# Patient Record
Sex: Female | Born: 1990 | Race: Black or African American | Hispanic: No | Smoking: Never smoker
Health system: Southern US, Community
[De-identification: ages and names within clinical notes are randomized; demographics above are authoritative.]

## PROBLEM LIST (undated history)

## (undated) ENCOUNTER — Inpatient Hospital Stay (HOSPITAL_COMMUNITY): Payer: Self-pay

## (undated) ENCOUNTER — Ambulatory Visit: Admission: EM | Payer: Medicaid Other | Source: Home / Self Care

## (undated) DIAGNOSIS — D649 Anemia, unspecified: Secondary | ICD-10-CM

## (undated) DIAGNOSIS — D696 Thrombocytopenia, unspecified: Secondary | ICD-10-CM

## (undated) DIAGNOSIS — Z789 Other specified health status: Secondary | ICD-10-CM

## (undated) DIAGNOSIS — J069 Acute upper respiratory infection, unspecified: Secondary | ICD-10-CM

## (undated) DIAGNOSIS — L309 Dermatitis, unspecified: Secondary | ICD-10-CM

## (undated) DIAGNOSIS — A749 Chlamydial infection, unspecified: Secondary | ICD-10-CM

## (undated) DIAGNOSIS — T783XXA Angioneurotic edema, initial encounter: Secondary | ICD-10-CM

## (undated) DIAGNOSIS — B9689 Other specified bacterial agents as the cause of diseases classified elsewhere: Secondary | ICD-10-CM

## (undated) DIAGNOSIS — A549 Gonococcal infection, unspecified: Secondary | ICD-10-CM

## (undated) DIAGNOSIS — N760 Acute vaginitis: Secondary | ICD-10-CM

## (undated) HISTORY — PX: NO PAST SURGERIES: SHX2092

## (undated) HISTORY — DX: Angioneurotic edema, initial encounter: T78.3XXA

## (undated) HISTORY — DX: Dermatitis, unspecified: L30.9

---
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
## 2013-09-23 ENCOUNTER — Encounter (HOSPITAL_COMMUNITY): Payer: Self-pay | Admitting: Advanced Practice Midwife

## 2013-09-23 ENCOUNTER — Inpatient Hospital Stay (HOSPITAL_COMMUNITY)
Admission: AD | Admit: 2013-09-23 | Discharge: 2013-09-23 | Disposition: A | Discharge status: Home / Self Care | Payer: Managed Care, Other (non HMO) | Source: Ambulatory Visit | Attending: Obstetrics & Gynecology | Admitting: Obstetrics & Gynecology

## 2013-09-23 ENCOUNTER — Inpatient Hospital Stay (HOSPITAL_COMMUNITY): Payer: Managed Care, Other (non HMO)

## 2013-09-23 DIAGNOSIS — B9689 Other specified bacterial agents as the cause of diseases classified elsewhere: Secondary | ICD-10-CM | POA: Diagnosis not present

## 2013-09-23 DIAGNOSIS — R1032 Left lower quadrant pain: Secondary | ICD-10-CM | POA: Diagnosis present

## 2013-09-23 DIAGNOSIS — O200 Threatened abortion: Secondary | ICD-10-CM | POA: Diagnosis not present

## 2013-09-23 DIAGNOSIS — A499 Bacterial infection, unspecified: Secondary | ICD-10-CM | POA: Diagnosis not present

## 2013-09-23 DIAGNOSIS — O2390 Unspecified genitourinary tract infection in pregnancy, unspecified trimester: Secondary | ICD-10-CM | POA: Insufficient documentation

## 2013-09-23 DIAGNOSIS — N760 Acute vaginitis: Secondary | ICD-10-CM | POA: Diagnosis not present

## 2013-09-23 HISTORY — DX: Thrombocytopenia, unspecified: D69.6

## 2013-09-23 LAB — WET PREP, GENITAL
Trich, Wet Prep: NONE SEEN
Yeast Wet Prep HPF POC: NONE SEEN

## 2013-09-23 LAB — CBC
HCT: 34.4 % — ABNORMAL LOW (ref 36.0–46.0)
Hemoglobin: 11.9 g/dL — ABNORMAL LOW (ref 12.0–15.0)
MCH: 28.5 pg (ref 26.0–34.0)
MCHC: 34.6 g/dL (ref 30.0–36.0)
MCV: 82.5 fL (ref 78.0–100.0)
PLATELETS: 208 10*3/uL (ref 150–400)
RBC: 4.17 MIL/uL (ref 3.87–5.11)
RDW: 13.8 % (ref 11.5–15.5)
WBC: 8.3 10*3/uL (ref 4.0–10.5)

## 2013-09-23 LAB — URINALYSIS, ROUTINE W REFLEX MICROSCOPIC
Bilirubin Urine: NEGATIVE
Glucose, UA: NEGATIVE mg/dL
Ketones, ur: NEGATIVE mg/dL
NITRITE: NEGATIVE
PH: 6 (ref 5.0–8.0)
Protein, ur: NEGATIVE mg/dL
Specific Gravity, Urine: 1.025 (ref 1.005–1.030)
Urobilinogen, UA: 0.2 mg/dL (ref 0.0–1.0)

## 2013-09-23 LAB — COMPREHENSIVE METABOLIC PANEL
ALT: 19 U/L (ref 0–35)
ANION GAP: 11 (ref 5–15)
AST: 18 U/L (ref 0–37)
Albumin: 3.9 g/dL (ref 3.5–5.2)
Alkaline Phosphatase: 52 U/L (ref 39–117)
BUN: 10 mg/dL (ref 6–23)
CALCIUM: 9.3 mg/dL (ref 8.4–10.5)
CO2: 25 mEq/L (ref 19–32)
Chloride: 103 mEq/L (ref 96–112)
Creatinine, Ser: 0.77 mg/dL (ref 0.50–1.10)
GFR calc non Af Amer: >90 mL/min (ref >90)
Glucose, Bld: 81 mg/dL (ref 70–99)
Potassium: 4.4 mEq/L (ref 3.7–5.3)
Sodium: 139 mEq/L (ref 137–147)
Total Bilirubin: <0.2 mg/dL — ABNORMAL LOW (ref 0.3–1.2)
Total Protein: 7.2 g/dL (ref 6.0–8.3)

## 2013-09-23 LAB — URINE MICROSCOPIC-ADD ON

## 2013-09-23 LAB — HCG, QUANTITATIVE, PREGNANCY: hCG, Beta Chain, Quant, S: 13563 m[IU]/mL — ABNORMAL HIGH (ref <5)

## 2013-09-23 LAB — POCT PREGNANCY, URINE: Preg Test, Ur: POSITIVE — AB

## 2013-09-23 LAB — ABO/RH: ABO/RH(D): B POS

## 2013-09-23 MED ORDER — METRONIDAZOLE 500 MG PO TABS: 500.0000 mg | ORAL_TABLET | Freq: Two times a day (BID) | ORAL | Status: DC

## 2013-09-23 NOTE — Discharge Instructions (Signed)
Bacterial Vaginosis Bacterial vaginosis is a vaginal infection that occurs when the normal balance of bacteria in the vagina is disrupted. It results from an overgrowth of certain bacteria. This is the most common vaginal infection in women of childbearing age. Treatment is important to prevent complications, especially in pregnant women, as it can cause a premature delivery. CAUSES  Bacterial vaginosis is caused by an increase in harmful bacteria that are normally present in smaller amounts in the vagina. Several different kinds of bacteria can cause bacterial vaginosis. However, the reason that the condition develops is not fully understood. RISK FACTORS Certain activities or behaviors can put you at an increased risk of developing bacterial vaginosis, including:  Having a new sex partner or multiple sex partners.  Douching.  Using an intrauterine device (IUD) for contraception. Women do not get bacterial vaginosis from toilet seats, bedding, swimming pools, or contact with objects around them. SIGNS AND SYMPTOMS  Some women with bacterial vaginosis have no signs or symptoms. Common symptoms include:  Grey vaginal discharge.  A fishlike odor with discharge, especially after sexual intercourse.  Itching or burning of the vagina and vulva.  Burning or pain with urination. DIAGNOSIS  Your health care provider will take a medical history and examine the vagina for signs of bacterial vaginosis. A sample of vaginal fluid may be taken. Your health care provider will look at this sample under a microscope to check for bacteria and abnormal cells. A vaginal pH test may also be done.  TREATMENT  Bacterial vaginosis may be treated with antibiotic medicines. These may be given in the form of a pill or a vaginal cream. A second round of antibiotics may be prescribed if the condition comes back after treatment.  HOME CARE INSTRUCTIONS   Only take over-the-counter or prescription medicines as  directed by your health care provider.  If antibiotic medicine was prescribed, take it as directed. Make sure you finish it even if you start to feel better.  Do not have sex until treatment is completed.  Tell all sexual partners that you have a vaginal infection. They should see their health care provider and be treated if they have problems, such as a mild rash or itching.  Practice safe sex by using condoms and only having one sex partner. SEEK MEDICAL CARE IF:   Your symptoms are not improving after 3 days of treatment.  You have increased discharge or pain.  You have a fever. MAKE SURE YOU:   Understand these instructions.  Will watch your condition.  Will get help right away if you are not doing well or get worse. FOR MORE INFORMATION  Centers for Disease Control and Prevention, Division of STD Prevention: AppraiserFraud.fi American Sexual Health Association (ASHA): www.ashastd.org  Document Released: 01/11/2005 Document Revised: 11/01/2012 Document Reviewed: 08/23/2012 [facility] Patient Information 2015 [place], [place]. This information is not intended to replace advice given to you by your health care provider. Make sure you discuss any questions you have with your health care provider.  Abdominal Pain During Pregnancy Abdominal pain is common in pregnancy. Most of the time, it does not cause harm. There are many causes of abdominal pain. Some causes are more serious than others. Some of the causes of abdominal pain in pregnancy are easily diagnosed. Occasionally, the diagnosis takes time to understand. Other times, the cause is not determined. Abdominal pain can be a sign that something is very wrong with the pregnancy, or the pain may have nothing to do with the pregnancy  at all. For this reason, always tell your health care provider if you have any abdominal discomfort. HOME CARE INSTRUCTIONS  Monitor your abdominal pain for any changes. The following actions may help to  alleviate any discomfort you are experiencing:  Do not have sexual intercourse or put anything in your vagina until your symptoms go away completely.  Get plenty of rest until your pain improves.  Drink clear fluids if you feel nauseous. Avoid solid food as long as you are uncomfortable or nauseous.  Only take over-the-counter or prescription medicine as directed by your health care provider.  Keep all follow-up appointments with your health care provider. SEEK IMMEDIATE MEDICAL CARE IF:  You are bleeding, leaking fluid, or passing tissue from the vagina.  You have increasing pain or cramping.  You have persistent vomiting.  You have painful or bloody urination.  You have a fever.  You notice a decrease in your baby's movements.  You have extreme weakness or feel faint.  You have shortness of breath, with or without abdominal pain.  You develop a severe headache with abdominal pain.  You have abnormal vaginal discharge with abdominal pain.  You have persistent diarrhea.  You have abdominal pain that continues even after rest, or gets worse. MAKE SURE YOU:   Understand these instructions.  Will watch your condition.  Will get help right away if you are not doing well or get worse. Document Released: 01/11/2005 Document Revised: 11/01/2012 Document Reviewed: 08/10/2012 [facility] Patient Information 2015 [place], [place]. This information is not intended to replace advice given to you by your health care provider. Make sure you discuss any questions you have with your health care provider.

## 2013-09-23 NOTE — MAU Provider Note (Signed)
Chief Complaint: Abdominal Cramping and Vaginal Bleeding   First Provider Initiated Contact with Patient 09/23/13 1359       SUBJECTIVE HPI: Sue Young is a 23 y.o. G1P0 at 5.5 weeks by LMP who presents with mild left lower quadrant cramping x3 days and spotting today. Positive home pregnancy test. No other testing this pregnancy.   Past Medical History  Diagnosis Date  . No pertinent past medical history   . Thrombocytopenia    OB History  Gravida Para Term Preterm AB SAB TAB Ectopic Multiple Living  2 1 1       1     # Outcome Date GA Lbr Len/2nd Weight Sex Delivery Anes PTL Lv  2 CUR           1 TRM              Past Surgical History  Procedure Laterality Date  . No past surgeries     History   Social History  . Marital Status: Married    Spouse Name: N/A    Number of Children: N/A  . Years of Education: N/A   Occupational History  . Not on file.   Social History Main Topics  . Smoking status: Never Smoker   . Smokeless tobacco: Never Used  . Alcohol Use: No  . Drug Use: No  . Sexual Activity: Yes    Birth Control/ Protection: None   Other Topics Concern  . Not on file   Social History Narrative  . No narrative on file   No current facility-administered medications on file prior to encounter.   Current Outpatient Prescriptions on File Prior to Encounter  Medication Sig Dispense Refill  . Prenatal Vit-Fe Fumarate-FA (PRENATAL MULTIVITAMIN) TABS Take 1 tablet by mouth at bedtime.       No Known Allergies  ROS: Pertinent positive items in HPI. Negative for fever, chills, nausea, vomiting, diarrhea, constipation, passage of tissue, vaginal discharge, urinary complaints or dyspareunia.  OBJECTIVE Blood pressure 139/77, pulse 82, temperature 98.4 F (36.9 C), resp. rate 18, height 5\' 6"  (1.676 m), weight 61.689 kg (136 lb), last menstrual period 08/14/2013. GENERAL: Well-developed, well-nourished female in no acute distress.  HEENT:  Normocephalic HEART: normal rate RESP: normal effort ABDOMEN: Soft, non-tender. No CVA tenderness. Positive bowel sounds x4. EXTREMITIES: Nontender, no edema NEURO: Alert and oriented SPECULUM EXAM: NEFG, moderate amount of thin, tan, malodorous discharge, no frank blood noted, cervix with 1.5 cm ectropion. Friable. No muco-purulent discharge. BIMANUAL: cervix closed; uterus slightly enlarged, no adnexal tenderness or masses. No cervical motion tenderness.  LAB RESULTS Results for orders placed during the hospital encounter of 09/23/13 (from the past 24 hour(s))  URINALYSIS, ROUTINE W REFLEX MICROSCOPIC     Status: Abnormal   Collection Time    09/23/13  1:35 PM      Result Value Ref Range   Color, Urine YELLOW  YELLOW   APPearance CLEAR  CLEAR   Specific Gravity, Urine 1.025  1.005 - 1.030   pH 6.0  5.0 - 8.0   Glucose, UA NEGATIVE  NEGATIVE mg/dL   Hgb urine dipstick LARGE (*) NEGATIVE   Bilirubin Urine NEGATIVE  NEGATIVE   Ketones, ur NEGATIVE  NEGATIVE mg/dL   Protein, ur NEGATIVE  NEGATIVE mg/dL   Urobilinogen, UA 0.2  0.0 - 1.0 mg/dL   Nitrite NEGATIVE  NEGATIVE   Leukocytes, UA TRACE (*) NEGATIVE  URINE MICROSCOPIC-ADD ON     Status: Abnormal   Collection Time  09/23/13  1:35 PM      Result Value Ref Range   Squamous Epithelial / LPF MANY (*) RARE   WBC, UA 7-10  <3 WBC/hpf   RBC / HPF 0-2  <3 RBC/hpf   Urine-Other MUCOUS PRESENT    POCT PREGNANCY, URINE     Status: Abnormal   Collection Time    09/23/13  1:44 PM      Result Value Ref Range   Preg Test, Ur POSITIVE (*) NEGATIVE  HCG, QUANTITATIVE, PREGNANCY     Status: Abnormal   Collection Time    09/23/13  1:55 PM      Result Value Ref Range   hCG, Beta Chain, Quant, S 13563 (*) <5 mIU/mL  ABO/RH     Status: None   Collection Time    09/23/13  1:55 PM      Result Value Ref Range   ABO/RH(D) B POS    CBC     Status: Abnormal   Collection Time    09/23/13  1:55 PM      Result Value Ref Range   WBC 8.3   4.0 - 10.5 K/uL   RBC 4.17  3.87 - 5.11 MIL/uL   Hemoglobin 11.9 (*) 12.0 - 15.0 g/dL   HCT 34.4 (*) 36.0 - 46.0 %   MCV 82.5  78.0 - 100.0 fL   MCH 28.5  26.0 - 34.0 pg   MCHC 34.6  30.0 - 36.0 g/dL   RDW 13.8  11.5 - 15.5 %   Platelets 208  150 - 400 K/uL  COMPREHENSIVE METABOLIC PANEL     Status: Abnormal   Collection Time    09/23/13  1:55 PM      Result Value Ref Range   Sodium 139  137 - 147 mEq/L   Potassium 4.4  3.7 - 5.3 mEq/L   Chloride 103  96 - 112 mEq/L   CO2 25  19 - 32 mEq/L   Glucose, Bld 81  70 - 99 mg/dL   BUN 10  6 - 23 mg/dL   Creatinine, Ser 0.77  0.50 - 1.10 mg/dL   Calcium 9.3  8.4 - 10.5 mg/dL   Total Protein 7.2  6.0 - 8.3 g/dL   Albumin 3.9  3.5 - 5.2 g/dL   AST 18  0 - 37 U/L   ALT 19  0 - 35 U/L   Alkaline Phosphatase 52  39 - 117 U/L   Total Bilirubin <0.2 (*) 0.3 - 1.2 mg/dL   GFR calc non Af Amer >90  >90 mL/min   GFR calc Af Amer >90  >90 mL/min   Anion gap 11  5 - 15  WET PREP, GENITAL     Status: Abnormal   Collection Time    09/23/13  2:52 PM      Result Value Ref Range   Yeast Wet Prep HPF POC NONE SEEN  NONE SEEN   Trich, Wet Prep NONE SEEN  NONE SEEN   Clue Cells Wet Prep HPF POC MANY (*) NONE SEEN   WBC, Wet Prep HPF POC MODERATE (*) NONE SEEN    IMAGING US Ob Comp Less 14 Wks  09/23/2013   CLINICAL DATA:  Vaginal bleeding  EXAM: OBSTETRIC <14 WK ULTRASOUND  TECHNIQUE: Transabdominal ultrasound was performed for evaluation of the gestation as well as the maternal uterus and adnexal regions.  COMPARISON:  None.  FINDINGS: Intrauterine gestational sac: Visualized/normal in shape.  Yolk sac:  Visualized  Embryo:  Visualized  Cardiac Activity: Visualized  Heart Rate: 97 bpm  MSD:   mm    w     d  CRL:   2.7  mm   5  w 6 d                  [place] EDC: 05/20/2014  Maternal uterus/adnexae: No subchorionic hemorrhage. 4.1 cm hemorrhagic cyst in the left ovary. Small amount of free fluid in the pelvis.  IMPRESSION: Five week 6 day intrauterine  pregnancy. Fetal heart rate 97 beats per min.  4.1 cm left ovarian hemorrhagic cyst   Electronically Signed   By: Rolm Baptise M.D.   On: 09/23/2013 15:48   US Ob Transvaginal  09/23/2013   CLINICAL DATA:  Vaginal bleeding  EXAM: OBSTETRIC <14 WK ULTRASOUND  TECHNIQUE: Transabdominal ultrasound was performed for evaluation of the gestation as well as the maternal uterus and adnexal regions.  COMPARISON:  None.  FINDINGS: Intrauterine gestational sac: Visualized/normal in shape.  Yolk sac:  Visualized  Embryo:  Visualized  Cardiac Activity: Visualized  Heart Rate: 97 bpm  MSD:   mm    w     d  CRL:   2.7  mm   5  w 6 d                  [place] EDC: 05/20/2014  Maternal uterus/adnexae: No subchorionic hemorrhage. 4.1 cm hemorrhagic cyst in the left ovary. Small amount of free fluid in the pelvis.  IMPRESSION: Five week 6 day intrauterine pregnancy. Fetal heart rate 97 beats per min.  4.1 cm left ovarian hemorrhagic cyst   Electronically Signed   By: Rolm Baptise M.D.   On: 09/23/2013 15:48   MAU COURSE  ASSESSMENT 1. Threatened miscarriage in early pregnancy   2. BV (bacterial vaginosis)    PLAN Discharge home in stable condition. Bleeding precautions. Pelvic rest x1 week.     Follow-up Information   Follow up with Catha Brow., MD. (Start prenatal care)    Specialty:  Obstetrics and Gynecology   Contact information:   [address] [place] [address] [phone]       Follow up with [facility]. (As needed in emergencies)    Contact information:   [address] [id number] [place] [address] [phone]       Medication List    STOP taking these medications       aspirin-acetaminophen-caffeine 250-250-65 MG per tablet  Commonly known as:  EXCEDRIN MIGRAINE      TAKE these medications       acetaminophen 325 MG tablet  Commonly known as:  TYLENOL  Take 650 mg by mouth every 4 (four) hours as needed  for mild pain or moderate pain.     metroNIDAZOLE 500 MG tablet  Commonly known as:  FLAGYL  Take 1 tablet (500 mg total) by mouth 2 (two) times daily.     prenatal multivitamin Tabs tablet  Take 1 tablet by mouth at bedtime.       [place], CNM 09/23/2013  4:00 PM

## 2013-09-23 NOTE — MAU Note (Signed)
Pt presents to MAU with complaints vaginal bleeding. Reports abdominal cramping for a couple of days. +HPT 2 weeks.

## 2013-09-25 LAB — GC/CHLAMYDIA PROBE AMP
CT Probe RNA: NEGATIVE
GC PROBE AMP APTIMA: NEGATIVE

## 2013-10-25 LAB — OB RESULTS CONSOLE ABO/RH: RH Type: POSITIVE

## 2013-10-25 LAB — OB RESULTS CONSOLE RUBELLA ANTIBODY, IGM: RUBELLA: IMMUNE

## 2013-10-25 LAB — OB RESULTS CONSOLE HIV ANTIBODY (ROUTINE TESTING): HIV: NONREACTIVE

## 2013-10-25 LAB — OB RESULTS CONSOLE GC/CHLAMYDIA
Chlamydia: NEGATIVE
Gonorrhea: NEGATIVE

## 2013-10-25 LAB — OB RESULTS CONSOLE ANTIBODY SCREEN: ANTIBODY SCREEN: NEGATIVE

## 2013-10-25 LAB — OB RESULTS CONSOLE RPR: RPR: NONREACTIVE

## 2013-11-26 ENCOUNTER — Encounter (HOSPITAL_COMMUNITY): Payer: Self-pay | Admitting: Advanced Practice Midwife

## 2014-01-02 ENCOUNTER — Inpatient Hospital Stay (HOSPITAL_COMMUNITY)
Admission: AD | Admit: 2014-01-02 | Discharge: 2014-01-02 | Disposition: A | Discharge status: Home / Self Care | Payer: Managed Care, Other (non HMO) | Source: Ambulatory Visit | Attending: Obstetrics | Admitting: Obstetrics

## 2014-01-02 ENCOUNTER — Encounter (HOSPITAL_COMMUNITY): Payer: Self-pay | Admitting: General Practice

## 2014-01-02 DIAGNOSIS — M549 Dorsalgia, unspecified: Secondary | ICD-10-CM | POA: Insufficient documentation

## 2014-01-02 DIAGNOSIS — J069 Acute upper respiratory infection, unspecified: Secondary | ICD-10-CM | POA: Diagnosis not present

## 2014-01-02 DIAGNOSIS — Z3A20 20 weeks gestation of pregnancy: Secondary | ICD-10-CM | POA: Insufficient documentation

## 2014-01-02 DIAGNOSIS — O99891 Other specified diseases and conditions complicating pregnancy: Secondary | ICD-10-CM

## 2014-01-02 DIAGNOSIS — R51 Headache: Secondary | ICD-10-CM | POA: Diagnosis present

## 2014-01-02 DIAGNOSIS — O9989 Other specified diseases and conditions complicating pregnancy, childbirth and the puerperium: Secondary | ICD-10-CM | POA: Insufficient documentation

## 2014-01-02 DIAGNOSIS — R6889 Other general symptoms and signs: Secondary | ICD-10-CM

## 2014-01-02 LAB — CBC WITH DIFFERENTIAL/PLATELET
BASOS ABS: 0 10*3/uL (ref 0.0–0.1)
BASOS PCT: 0 % (ref 0–1)
EOS ABS: 0.2 10*3/uL (ref 0.0–0.7)
Eosinophils Relative: 1 % (ref 0–5)
HCT: 32.2 % — ABNORMAL LOW (ref 36.0–46.0)
Hemoglobin: 11.1 g/dL — ABNORMAL LOW (ref 12.0–15.0)
Lymphocytes Relative: 8 % — ABNORMAL LOW (ref 12–46)
Lymphs Abs: 1.3 10*3/uL (ref 0.7–4.0)
MCH: 28.7 pg (ref 26.0–34.0)
MCHC: 34.5 g/dL (ref 30.0–36.0)
MCV: 83.2 fL (ref 78.0–100.0)
MONOS PCT: 7 % (ref 3–12)
Monocytes Absolute: 1.1 10*3/uL — ABNORMAL HIGH (ref 0.1–1.0)
NEUTROS ABS: 14.8 10*3/uL — AB (ref 1.7–7.7)
Neutrophils Relative %: 85 % — ABNORMAL HIGH (ref 43–77)
Platelets: 99 10*3/uL — ABNORMAL LOW (ref 150–400)
RBC: 3.87 MIL/uL (ref 3.87–5.11)
RDW: 13.6 % (ref 11.5–15.5)
WBC: 17.5 10*3/uL — ABNORMAL HIGH (ref 4.0–10.5)

## 2014-01-02 LAB — RAPID STREP SCREEN (MED CTR MEBANE ONLY): Streptococcus, Group A Screen (Direct): NEGATIVE

## 2014-01-02 LAB — URINALYSIS, ROUTINE W REFLEX MICROSCOPIC
Bilirubin Urine: NEGATIVE
Glucose, UA: NEGATIVE mg/dL
Ketones, ur: 15 mg/dL — AB
NITRITE: NEGATIVE
PROTEIN: NEGATIVE mg/dL
SPECIFIC GRAVITY, URINE: 1.015 (ref 1.005–1.030)
UROBILINOGEN UA: 0.2 mg/dL (ref 0.0–1.0)
pH: 7 (ref 5.0–8.0)

## 2014-01-02 LAB — URINE MICROSCOPIC-ADD ON

## 2014-01-02 LAB — INFLUENZA PANEL BY PCR (TYPE A & B)
H1N1FLUPCR: NOT DETECTED
INFLAPCR: NEGATIVE
Influenza B By PCR: NEGATIVE

## 2014-01-02 MED ORDER — METOCLOPRAMIDE HCL 5 MG/ML IJ SOLN
10.0000 mg | Freq: Once | INTRAMUSCULAR | Status: AC
  Administered 2014-01-02: 10 mg via INTRAVENOUS
  Filled 2014-01-02: qty 2

## 2014-01-02 MED ORDER — LACTATED RINGERS IV BOLUS (SEPSIS)
1000.0000 mL | Freq: Once | INTRAVENOUS | Status: AC
  Administered 2014-01-02: 1000 mL via INTRAVENOUS

## 2014-01-02 MED ORDER — DIPHENHYDRAMINE HCL 50 MG/ML IJ SOLN
25.0000 mg | Freq: Once | INTRAMUSCULAR | Status: AC
  Administered 2014-01-02: 25 mg via INTRAVENOUS
  Filled 2014-01-02: qty 1

## 2014-01-02 MED ORDER — DEXAMETHASONE SODIUM PHOSPHATE 10 MG/ML IJ SOLN
10.0000 mg | Freq: Once | INTRAMUSCULAR | Status: AC
  Administered 2014-01-02: 10 mg via INTRAVENOUS
  Filled 2014-01-02: qty 1

## 2014-01-02 NOTE — MAU Note (Signed)
R flank pain for the past 2 weeks - MD aware, HA & body aches started yesterday, body is tender to touch, slight cough & sore throat.  Denies fever.  Denies bleeding or LOF.

## 2014-01-02 NOTE — MAU Provider Note (Signed)
History     CSN: [id number]  Arrival date and time: 01/02/14 0845   None     Chief Complaint  Patient presents with  . Flank Pain  . Generalized Body Aches  . Headache   HPI   [name] is a 23 y.o. G2P1001 at [redacted]w[redacted]d female who presents with HA, back pain and body aches. She has had URI symptoms; the symptoms have gotten worse started yesterday. She has tried taking tylenol with minimal relief. The lower back pain has been going on for "weeks". She feels overall body pains- aches.  Temp at home was 99.0 Patient has not taken anything for the HA pain.   OB History    Gravida Para Term Preterm AB TAB SAB Ectopic Multiple Living   2 1 1       1       Past Medical History  Diagnosis Date  . No pertinent past medical history   . Thrombocytopenia     Past Surgical History  Procedure Laterality Date  . No past surgeries      Family History  Problem Relation Age of Onset  . Diabetes Mother     History  Substance Use Topics  . Smoking status: Never Smoker   . Smokeless tobacco: Never Used  . Alcohol Use: No    Allergies: No Known Allergies  Prescriptions prior to admission  Medication Sig Dispense Refill Last Dose  . acetaminophen (TYLENOL) 500 MG tablet Take 1,000 mg by mouth every 6 (six) hours as needed for mild pain or headache.   01/01/2014 at Unknown time  . Prenatal Vit-Fe Fumarate-FA (PRENATAL MULTIVITAMIN) TABS Take 1 tablet by mouth at bedtime.   01/01/2014 at Unknown time  . metroNIDAZOLE (FLAGYL) 500 MG tablet Take 1 tablet (500 mg total) by mouth 2 (two) times daily. (Patient not taking: Reported on 01/02/2014) 14 tablet 0    Results for orders placed or performed during the hospital encounter of 01/02/14 (from the past 48 hour(s))  Urinalysis, Routine w reflex microscopic     Status: Abnormal   Collection Time: 01/02/14  9:09 AM  Result Value Ref Range   Color, Urine YELLOW YELLOW   APPearance CLEAR CLEAR   Specific Gravity, Urine 1.015  1.005 - 1.030   pH 7.0 5.0 - 8.0   Glucose, UA NEGATIVE NEGATIVE mg/dL   Hgb urine dipstick TRACE (A) NEGATIVE   Bilirubin Urine NEGATIVE NEGATIVE   Ketones, ur 15 (A) NEGATIVE mg/dL   Protein, ur NEGATIVE NEGATIVE mg/dL   Urobilinogen, UA 0.2 0.0 - 1.0 mg/dL   Nitrite NEGATIVE NEGATIVE   Leukocytes, UA MODERATE (A) NEGATIVE  Urine microscopic-add on     Status: Abnormal   Collection Time: 01/02/14  9:09 AM  Result Value Ref Range   Squamous Epithelial / LPF RARE RARE   WBC, UA 7-10 <3 WBC/hpf   Bacteria, UA FEW (A) RARE  CBC with Differential     Status: Abnormal   Collection Time: 01/02/14 10:06 AM  Result Value Ref Range   WBC 17.5 (H) 4.0 - 10.5 K/uL   RBC 3.87 3.87 - 5.11 MIL/uL   Hemoglobin 11.1 (L) 12.0 - 15.0 g/dL   HCT 32.2 (L) 36.0 - 46.0 %   MCV 83.2 78.0 - 100.0 fL   MCH 28.7 26.0 - 34.0 pg   MCHC 34.5 30.0 - 36.0 g/dL   RDW 13.6 11.5 - 15.5 %   Platelets 99 (L) 150 - 400 K/uL    Comment:  REPEATED TO VERIFY SPECIMEN CHECKED FOR CLOTS PLATELET COUNT CONFIRMED BY SMEAR    Neutrophils Relative % 85 (H) 43 - 77 %   Neutro Abs 14.8 (H) 1.7 - 7.7 K/uL   Lymphocytes Relative 8 (L) 12 - 46 %   Lymphs Abs 1.3 0.7 - 4.0 K/uL   Monocytes Relative 7 3 - 12 %   Monocytes Absolute 1.1 (H) 0.1 - 1.0 K/uL   Eosinophils Relative 1 0 - 5 %   Eosinophils Absolute 0.2 0.0 - 0.7 K/uL   Basophils Relative 0 0 - 1 %   Basophils Absolute 0.0 0.0 - 0.1 K/uL  Influenza panel by PCR (type A & B, H1N1)     Status: None   Collection Time: 01/02/14 11:00 AM  Result Value Ref Range   Influenza A By PCR NEGATIVE NEGATIVE   Influenza B By PCR NEGATIVE NEGATIVE   H1N1 flu by pcr NOT DETECTED NOT DETECTED    Comment:        The Xpert Flu assay (FDA approved for nasal aspirates or washes and nasopharyngeal swab specimens), is intended as an aid in the diagnosis of influenza and should not be used as a sole basis for treatment. Performed at [facility]   Rapid strep screen      Status: None   Collection Time: 01/02/14 11:00 AM  Result Value Ref Range   Streptococcus, Group A Screen (Direct) NEGATIVE NEGATIVE    Comment: (NOTE) A Rapid Antigen test may result negative if the antigen level in the sample is below the detection level of this test. The FDA has not cleared this test as a stand-alone test therefore the rapid antigen negative result has reflexed to a Group A Strep culture. Performed at [facility]     Review of Systems  Constitutional: Positive for fever and chills.  HENT: Positive for congestion, ear pain and sore throat.   Respiratory: Positive for cough.   Neurological: Positive for headaches (Rates her pain 10/10; feels like a migraine. ).   Physical Exam   Blood pressure 127/75, pulse 127, temperature 99 F (37.2 C), temperature source Oral, resp. rate 18, last menstrual period 08/14/2013, SpO2 100 %.  Physical Exam  Constitutional: She is oriented to person, place, and time. She appears well-developed and well-nourished. She has a sickly appearance. She appears ill. No distress.  HENT:  Right Ear: Hearing and external ear normal. No drainage or tenderness. Tympanic membrane is erythematous. Tympanic membrane is not bulging.  Left Ear: Hearing and external ear normal. No drainage or tenderness. Tympanic membrane is erythematous. Tympanic membrane is not bulging.  Eyes: Pupils are equal, round, and reactive to light.  Cardiovascular: Normal rate and normal heart sounds.   Respiratory: Effort normal and breath sounds normal. No respiratory distress. She has no wheezes. She has no rales.  GI: There is no CVA tenderness.  Musculoskeletal: Normal range of motion.  Neurological: She is alert and oriented to person, place, and time.  Skin: Skin is warm. She is not diaphoretic.  Psychiatric: Her behavior is normal.    MAU Course  Procedures  None  MDM UA  Fetal heart tones by doppler+ CBC Influenza/strep swab collected LR bolus  with benadryl, reglan, decadron  Urine culture pending; will wait to treat until culture results; ROS negative  Discussed patient with Dr. Carlis Abbott @ 1030. Patient states that her HA has subsided   Assessment and Plan   A: 1. Upper respiratory virus   2.  Back pain in pregnancy   3. Flu-like symptoms    P: Discharge home in stable condition A list of safe medications given to patient for OTC cold medicine  Ok to take tylenol as needed, as directed on the bottle Increase water intake  I will call the patient with results of flu and strep Follow up with OB as scheduled or sooner if symptoms do not improve over the next few days.   Darrelyn Hillock Rasch, NP 01/02/2014 1:31 PM   1615: Patient notified of influenza and strep swab results.

## 2014-01-03 LAB — URINE CULTURE
Colony Count: 50000
Special Requests: NORMAL

## 2014-01-04 LAB — CULTURE, GROUP A STREP

## 2014-01-25 NOTE — L&D Delivery Note (Signed)
Delivery Note Patient c/o pressure and was noted to be 10/10/+3.  She pushed once, and at 12:11 PM a viable female was delivered via Vaginal, Spontaneous Delivery (Presentation: Left Occiput Anterior).  APGAR: 8, 9; weight  pending.   Placenta status: Intact, Spontaneous.  Cord: 3 vessels with the following complications: None.  Cord pH: n/a  Anesthesia: Local  Episiotomy: None Lacerations:  2nd degree perineal Suture Repair: 3.0 vicryl rapide Est. Blood Loss (mL): 575  Immediately after delivery of the placenta, brisk vaginal bleeding was noted.  A uterine sweep was performed and a small amount of clot removed from the LUS.  After the repair, again, brisk vaginal bleeding noted.  Fundus was firm, and fundal massage improved bleeding.  1000 mcg of rectal cytotec was placed apophylactically.    Mom to postpartum.  Baby to Couplet care / Skin to Skin.  [place] 05/24/2014, 1:24 PM

## 2014-03-18 ENCOUNTER — Telehealth: Payer: Self-pay | Admitting: Hematology

## 2014-03-18 NOTE — Telephone Encounter (Signed)
pt confirmed appt for 04/10/14 at 10:30 w/Feng Dx:  Thrombocytopenic Referral Dr. Rogue Bussing

## 2014-04-10 ENCOUNTER — Ambulatory Visit: Payer: Medicaid Other

## 2014-04-10 ENCOUNTER — Ambulatory Visit (HOSPITAL_BASED_OUTPATIENT_CLINIC_OR_DEPARTMENT_OTHER): Payer: Medicaid Other | Admitting: Hematology

## 2014-04-10 ENCOUNTER — Other Ambulatory Visit: Payer: Self-pay | Admitting: *Deleted

## 2014-04-10 ENCOUNTER — Encounter: Payer: Self-pay | Admitting: Hematology

## 2014-04-10 ENCOUNTER — Telehealth: Payer: Self-pay | Admitting: Hematology

## 2014-04-10 ENCOUNTER — Ambulatory Visit (HOSPITAL_BASED_OUTPATIENT_CLINIC_OR_DEPARTMENT_OTHER): Payer: Medicaid Other

## 2014-04-10 VITALS — BP 136/68 | HR 100 | Temp 98.4°F | Resp 18 | Ht 66.0 in | Wt 170.5 lb

## 2014-04-10 DIAGNOSIS — D696 Thrombocytopenia, unspecified: Secondary | ICD-10-CM

## 2014-04-10 DIAGNOSIS — D649 Anemia, unspecified: Secondary | ICD-10-CM

## 2014-04-10 DIAGNOSIS — D508 Other iron deficiency anemias: Secondary | ICD-10-CM

## 2014-04-10 DIAGNOSIS — O99013 Anemia complicating pregnancy, third trimester: Secondary | ICD-10-CM

## 2014-04-10 LAB — URINALYSIS, MICROSCOPIC - CHCC
Bilirubin (Urine): NEGATIVE
Blood: NEGATIVE
Glucose: NEGATIVE mg/dL
Ketones: NEGATIVE mg/dL
NITRITE: NEGATIVE
PROTEIN: NEGATIVE mg/dL
Specific Gravity, Urine: 1.015 (ref 1.003–1.035)
UROBILINOGEN UR: 0.2 mg/dL (ref 0.2–1)
pH: 6.5 (ref 4.6–8.0)

## 2014-04-10 LAB — COMPREHENSIVE METABOLIC PANEL (CC13)
ALBUMIN: 3 g/dL — AB (ref 3.5–5.0)
ALK PHOS: 100 U/L (ref 40–150)
ALT: 14 U/L (ref 0–55)
AST: 15 U/L (ref 5–34)
Anion Gap: 9 mEq/L (ref 3–11)
BUN: 5.5 mg/dL — ABNORMAL LOW (ref 7.0–26.0)
CALCIUM: 8.8 mg/dL (ref 8.4–10.4)
CHLORIDE: 107 meq/L (ref 98–109)
CO2: 22 mEq/L (ref 22–29)
Creatinine: 0.7 mg/dL (ref 0.6–1.1)
EGFR: >90 mL/min/{1.73_m2} (ref >90)
Glucose: 101 mg/dl (ref 70–140)
POTASSIUM: 3.6 meq/L (ref 3.5–5.1)
SODIUM: 138 meq/L (ref 136–145)
TOTAL PROTEIN: 7.2 g/dL (ref 6.4–8.3)
Total Bilirubin: 0.47 mg/dL (ref 0.20–1.20)

## 2014-04-10 LAB — CBC & DIFF AND RETIC
BASO%: 0.2 % (ref 0.0–2.0)
BASOS ABS: 0 10*3/uL (ref 0.0–0.1)
EOS%: 3.1 % (ref 0.0–7.0)
Eosinophils Absolute: 0.3 10*3/uL (ref 0.0–0.5)
HEMATOCRIT: 33.6 % — AB (ref 34.8–46.6)
HGB: 11.5 g/dL — ABNORMAL LOW (ref 11.6–15.9)
IMMATURE RETIC FRACT: 10.6 % — AB (ref 1.60–10.00)
LYMPH#: 1.9 10*3/uL (ref 0.9–3.3)
LYMPH%: 18.1 % (ref 14.0–49.7)
MCH: 28.1 pg (ref 25.1–34.0)
MCHC: 34.2 g/dL (ref 31.5–36.0)
MCV: 82.2 fL (ref 79.5–101.0)
MONO#: 0.4 10*3/uL (ref 0.1–0.9)
MONO%: 3.9 % (ref 0.0–14.0)
NEUT#: 7.8 10*3/uL — ABNORMAL HIGH (ref 1.5–6.5)
NEUT%: 74.7 % (ref 38.4–76.8)
Platelets: 90 10*3/uL — ABNORMAL LOW (ref 145–400)
RBC: 4.09 10*6/uL (ref 3.70–5.45)
RDW: 13.6 % (ref 11.2–14.5)
Retic %: 1.58 % (ref 0.70–2.10)
Retic Ct Abs: 64.62 10*3/uL (ref 33.70–90.70)
WBC: 10.5 10*3/uL — ABNORMAL HIGH (ref 3.9–10.3)

## 2014-04-10 LAB — LACTATE DEHYDROGENASE (CC13): LDH: 181 U/L (ref 125–245)

## 2014-04-10 LAB — IRON AND TIBC CHCC
%SAT: 15 % — AB (ref 21–57)
Iron: 83 ug/dL (ref 41–142)
TIBC: 546 ug/dL — ABNORMAL HIGH (ref 236–444)
UIBC: 463 ug/dL — AB (ref 120–384)

## 2014-04-10 LAB — FERRITIN CHCC: Ferritin: 15 ng/ml (ref 9–269)

## 2014-04-10 LAB — DRAW EXTRA CLOT TUBE

## 2014-04-10 NOTE — Progress Notes (Signed)
Checked in new pt with no financial concerns at this time.  Pt's insurance should pay at 100% so financial assistance may not be needed but she has Raquel's card for any billing questions or concerns.

## 2014-04-10 NOTE — Telephone Encounter (Signed)
gv and printed appt sched adn avs for pt for March adn April...sed added tx.

## 2014-04-10 NOTE — Progress Notes (Signed)
[facility]  Telephone:([phone]) [phone] Fax:([phone]) [place] consult Note   Patient Care Team: Sanjuana Kava, MD as PCP - General (Obstetrics and Gynecology) 04/10/2014  CHIEF COMPLAINTS/PURPOSE OF CONSULTATION:  Thrombocytopenia   Referring physician: Dr. Alwyn Pea  HISTORY OF PRESENTING ILLNESS:  Sue Young 24 y.o. female who is pregnant at [redacted] weeks, is here because of thrombocytopenia.  This is her second pregnancy. She delivered a healthy baby about 3 years ago. She said that she had a normal platelet count until when she was admitted for delivery and her platelet counts was found to be low. She was not able to have epidural because that. She did not have excessive bleeding after the vaginal delivery. The patient her plate count recovered after delivery.  She has been doing well with this pregnancy except one episodes of flu syndromes in Dec 2015 and she was treated in emergency room.   She did notice some skin bruising and mild come bleeding when she brushes her teeth in the past months.  She denied any bleeding episodes including hematochezia, melana, hemoptysis, hematuria or epitaxis. She was not told to have low platelet count until last months when her CBC showed platelet count 26K. she was referred for further evaluation.She was found to have low plt 26K,   MEDICAL HISTORY:  Past Medical History  Diagnosis Date  . No pertinent past medical history   . Thrombocytopenia     SURGICAL HISTORY: Past Surgical History  Procedure Laterality Date  . No past surgeries      SOCIAL HISTORY: History   Social History  . Marital Status: Married    Spouse Name: N/A  . Number of Children: N/A  . Years of Education: N/A   Occupational History  . Not on file.   Social History Main Topics  . Smoking status: Never Smoker   . Smokeless tobacco: Never Used  . Alcohol Use: No  . Drug Use: No  . Sexual Activity: Yes    Birth Control/ Protection: None    Other Topics Concern  . Not on file   Social History Narrative    FAMILY HISTORY: Family History  Problem Relation Age of Onset  . Diabetes Mother     ALLERGIES:  has No Known Allergies.  MEDICATIONS:  Current Outpatient Prescriptions  Medication Sig Dispense Refill  . acetaminophen (TYLENOL) 500 MG tablet Take 1,000 mg by mouth every 6 (six) hours as needed for mild pain or headache.    . Prenatal Vit-Fe Fumarate-FA (PRENATAL MULTIVITAMIN) TABS Take 1 tablet by mouth at bedtime.     No current facility-administered medications for this visit.    REVIEW OF SYSTEMS:   Constitutional: Denies fevers, chills or abnormal night sweats Eyes: Denies blurriness of vision, double vision or watery eyes Ears, nose, mouth, throat, and face: Denies mucositis or sore throat Respiratory: Denies cough, dyspnea or wheezes Cardiovascular: Denies palpitation, chest discomfort or lower extremity swelling Gastrointestinal:  Denies nausea, heartburn or change in bowel habits Skin: Denies abnormal skin rashes Lymphatics: Denies new lymphadenopathy or easy bruising Neurological:Denies numbness, tingling or new weaknesses Behavioral/Psych: Mood is stable, no new changes  All other systems were reviewed with the patient and are negative.  PHYSICAL EXAMINATION: ECOG PERFORMANCE STATUS: 0 - Asymptomatic  Filed Vitals:   04/10/14 1134  BP: 136/68  Pulse: 100  Temp: 98.4 F (36.9 C)  Resp: 18   Filed Weights   04/10/14 1134  Weight: 170 lb 8 oz (77.338 kg)  GENERAL:alert, no distress and comfortable SKIN: skin color, texture, turgor are normal, no rashes or significant lesions, no petechia or bruising EYES: normal, conjunctiva are pink and non-injected, sclera clear OROPHARYNX:no exudate, no erythema and lips, buccal mucosa, and tongue normal  NECK: supple, thyroid normal size, non-tender, without nodularity LYMPH:  no palpable lymphadenopathy in the cervical, axillary or  inguinal LUNGS: clear to auscultation and percussion with normal breathing effort HEART: regular rate & rhythm and no murmurs and no lower extremity edema ABDOMEN:abdomen soft, non-tender and normal bowel sounds Musculoskeletal:no cyanosis of digits and no clubbing  PSYCH: alert & oriented x 3 with fluent speech NEURO: no focal motor/sensory deficits  LABORATORY DATA:  I have reviewed the data as listed CBC Latest Ref Rng 04/10/2014 01/02/2014 09/23/2013  WBC 3.9 - 10.3 10e3/uL 10.5(H) 17.5(H) 8.3  Hemoglobin 11.6 - 15.9 g/dL 11.5(L) 11.1(L) 11.9(L)  Hematocrit 34.8 - 46.6 % 33.6(L) 32.2(L) 34.4(L)  Platelets 145 - 400 10e3/uL 90 Platelet count consistent in citrate(L) 99(L) 208    CMP Latest Ref Rng 09/23/2013 07/07/2010  Glucose 70 - 99 mg/dL 81 86  BUN 6 - 23 mg/dL 10 13  Creatinine 0.50 - 1.10 mg/dL 0.77 0.72  Sodium 137 - 147 mEq/L 139 135  Potassium 3.7 - 5.3 mEq/L 4.4 4.1  Chloride 96 - 112 mEq/L 103 104  CO2 19 - 32 mEq/L 25 22  Calcium 8.4 - 10.5 mg/dL 9.3 8.6  Total Protein 6.0 - 8.3 g/dL 7.2 -  Total Bilirubin 0.3 - 1.2 mg/dL <0.2(L) -  Alkaline Phos 39 - 117 U/L 52 -  AST 0 - 37 U/L 18 -  ALT 0 - 35 U/L 19 -     RADIOGRAPHIC STUDIES: I have personally reviewed the radiological images as listed and agreed with the findings in the report. No results found.  ASSESSMENT & PLAN:  24 year old African-American female, without significant past medical history, presented with thrombocytopenia during her pregnancy.  1. Thrombocytopenia, probable related to her pregnancy. -I'll try to get her lab results from her prior delivery in [place] and her current OB Dr. Alwyn Pea.  -Giving her asymptomatic, no medication, normal CMP, this is likely ITP. HELLP syndrome is unlikely -I'll also check her liver and spleen after her delivery. We'll rule out chronic hepatitis B and hepatitis C -Her platelet count today is 90, I recommend close monitoring. I will consider steroids if her platelet  count drops to below 50. -If her platelet count drops significantly before delivery, I would consider dexamethasone pulse or IVIG in the hospital.  2.  normocytic hypoproductive anemia  -Her ferritin level is 15 today, iron saturation 15%, TIBC elevated at 546. This is consistent with iron deficient anemia  -I recommend her to take iron pill 1-2 tablets daily.   Follow-up: I'll see her back in 4 weeks. CBC weekly.  All questions were answered. The patient knows to call the clinic with any problems, questions or concerns. I spent 30 minutes counseling the patient face to face. The total time spent in the appointment was 40 minutes and more than 50% was on counseling.     Truitt Merle, MD 04/10/2014 11:51 AM

## 2014-04-11 LAB — FOLATE RBC: RBC Folate: 807 ng/mL (ref >280)

## 2014-04-11 LAB — ANCA SCREEN W REFLEX TITER
Atypical p-ANCA Screen: NEGATIVE
c-ANCA Screen: NEGATIVE
p-ANCA Screen: NEGATIVE

## 2014-04-11 LAB — HEPATITIS B SURFACE ANTIGEN: HEP B S AG: NEGATIVE

## 2014-04-11 LAB — PROTHROMBIN TIME
INR: 1.04 (ref <1.50)
Prothrombin Time: 13.6 seconds (ref 11.6–15.2)

## 2014-04-11 LAB — HAPTOGLOBIN: HAPTOGLOBIN: 111 mg/dL (ref 43–212)

## 2014-04-11 LAB — APTT: APTT: 28 s (ref 24–37)

## 2014-04-11 LAB — FIBRINOGEN: Fibrinogen: 447 mg/dL (ref 204–475)

## 2014-04-11 LAB — RHEUMATOID FACTOR: Rhuematoid fact SerPl-aCnc: <10 IU/mL (ref <=14)

## 2014-04-11 LAB — HEPATITIS C ANTIBODY: HCV Ab: NEGATIVE

## 2014-04-11 LAB — VITAMIN B12: VITAMIN B 12: 462 pg/mL (ref 211–911)

## 2014-04-11 LAB — D-DIMER, QUANTITATIVE (NOT AT ARMC): D DIMER QUANT: 0.59 ug{FEU}/mL — AB (ref 0.00–0.48)

## 2014-04-11 LAB — ANA: Anti Nuclear Antibody(ANA): NEGATIVE

## 2014-04-15 ENCOUNTER — Telehealth: Payer: Self-pay | Admitting: Hematology

## 2014-04-15 NOTE — Telephone Encounter (Signed)
Left message to confirm appointment changed from 04/13 to 04/4. Informed patient she can pick calendar up at next appointment.

## 2014-04-15 NOTE — Telephone Encounter (Signed)
04/15/14 fax a request of records to [phone] [facility] [place], [place])

## 2014-04-16 ENCOUNTER — Other Ambulatory Visit: Payer: Self-pay | Admitting: *Deleted

## 2014-04-16 DIAGNOSIS — D696 Thrombocytopenia, unspecified: Secondary | ICD-10-CM

## 2014-04-17 ENCOUNTER — Other Ambulatory Visit (HOSPITAL_BASED_OUTPATIENT_CLINIC_OR_DEPARTMENT_OTHER): Payer: Medicaid Other

## 2014-04-17 DIAGNOSIS — D696 Thrombocytopenia, unspecified: Secondary | ICD-10-CM

## 2014-04-17 DIAGNOSIS — O99013 Anemia complicating pregnancy, third trimester: Secondary | ICD-10-CM

## 2014-04-17 LAB — CBC WITH DIFFERENTIAL/PLATELET
BASO%: 0.5 % (ref 0.0–2.0)
BASOS ABS: 0.1 10*3/uL (ref 0.0–0.1)
EOS%: 5.4 % (ref 0.0–7.0)
Eosinophils Absolute: 0.6 10*3/uL — ABNORMAL HIGH (ref 0.0–0.5)
HCT: 33.8 % — ABNORMAL LOW (ref 34.8–46.6)
HGB: 11.1 g/dL — ABNORMAL LOW (ref 11.6–15.9)
LYMPH#: 1.4 10*3/uL (ref 0.9–3.3)
LYMPH%: 14.1 % (ref 14.0–49.7)
MCH: 27.3 pg (ref 25.1–34.0)
MCHC: 32.9 g/dL (ref 31.5–36.0)
MCV: 83 fL (ref 79.5–101.0)
MONO#: 0.8 10*3/uL (ref 0.1–0.9)
MONO%: 7.6 % (ref 0.0–14.0)
NEUT#: 7.4 10*3/uL — ABNORMAL HIGH (ref 1.5–6.5)
NEUT%: 72.4 % (ref 38.4–76.8)
Platelets: 154 10*3/uL (ref 145–400)
RBC: 4.08 10*6/uL (ref 3.70–5.45)
RDW: 14 % (ref 11.2–14.5)
WBC: 10.3 10*3/uL (ref 3.9–10.3)

## 2014-04-24 ENCOUNTER — Other Ambulatory Visit (HOSPITAL_BASED_OUTPATIENT_CLINIC_OR_DEPARTMENT_OTHER): Payer: Medicaid Other

## 2014-04-24 DIAGNOSIS — O99013 Anemia complicating pregnancy, third trimester: Secondary | ICD-10-CM

## 2014-04-24 DIAGNOSIS — D696 Thrombocytopenia, unspecified: Secondary | ICD-10-CM

## 2014-04-24 LAB — CBC WITH DIFFERENTIAL/PLATELET
BASO%: 0.8 % (ref 0.0–2.0)
BASOS ABS: 0.1 10*3/uL (ref 0.0–0.1)
EOS ABS: 0.6 10*3/uL — AB (ref 0.0–0.5)
EOS%: 5.2 % (ref 0.0–7.0)
HCT: 33.7 % — ABNORMAL LOW (ref 34.8–46.6)
HEMOGLOBIN: 10.8 g/dL — AB (ref 11.6–15.9)
LYMPH%: 15.2 % (ref 14.0–49.7)
MCH: 26.5 pg (ref 25.1–34.0)
MCHC: 32.2 g/dL (ref 31.5–36.0)
MCV: 82.3 fL (ref 79.5–101.0)
MONO#: 0.8 10*3/uL (ref 0.1–0.9)
MONO%: 6.9 % (ref 0.0–14.0)
NEUT#: 8.3 10*3/uL — ABNORMAL HIGH (ref 1.5–6.5)
NEUT%: 71.9 % (ref 38.4–76.8)
Platelets: 102 10*3/uL — ABNORMAL LOW (ref 145–400)
RBC: 4.09 10*6/uL (ref 3.70–5.45)
RDW: 14.1 % (ref 11.2–14.5)
WBC: 11.5 10*3/uL — ABNORMAL HIGH (ref 3.9–10.3)
lymph#: 1.8 10*3/uL (ref 0.9–3.3)

## 2014-04-24 LAB — OB RESULTS CONSOLE GBS: GBS: NEGATIVE

## 2014-05-01 ENCOUNTER — Other Ambulatory Visit (HOSPITAL_BASED_OUTPATIENT_CLINIC_OR_DEPARTMENT_OTHER): Payer: Medicaid Other

## 2014-05-01 DIAGNOSIS — C187 Malignant neoplasm of sigmoid colon: Secondary | ICD-10-CM

## 2014-05-01 DIAGNOSIS — D649 Anemia, unspecified: Secondary | ICD-10-CM | POA: Diagnosis not present

## 2014-05-01 DIAGNOSIS — D696 Thrombocytopenia, unspecified: Secondary | ICD-10-CM

## 2014-05-01 LAB — CBC WITH DIFFERENTIAL/PLATELET
BASO%: 0.4 % (ref 0.0–2.0)
BASOS ABS: 0 10*3/uL (ref 0.0–0.1)
EOS%: 4.7 % (ref 0.0–7.0)
Eosinophils Absolute: 0.5 10*3/uL (ref 0.0–0.5)
HCT: 34.3 % — ABNORMAL LOW (ref 34.8–46.6)
HEMOGLOBIN: 11 g/dL — AB (ref 11.6–15.9)
LYMPH#: 1.7 10*3/uL (ref 0.9–3.3)
LYMPH%: 15.2 % (ref 14.0–49.7)
MCH: 26.2 pg (ref 25.1–34.0)
MCHC: 31.9 g/dL (ref 31.5–36.0)
MCV: 82.1 fL (ref 79.5–101.0)
MONO#: 1 10*3/uL — AB (ref 0.1–0.9)
MONO%: 8.5 % (ref 0.0–14.0)
NEUT#: 8.2 10*3/uL — ABNORMAL HIGH (ref 1.5–6.5)
NEUT%: 71.2 % (ref 38.4–76.8)
PLATELETS: 102 10*3/uL — AB (ref 145–400)
RBC: 4.18 10*6/uL (ref 3.70–5.45)
RDW: 13.8 % (ref 11.2–14.5)
WBC: 11.4 10*3/uL — AB (ref 3.9–10.3)

## 2014-05-08 ENCOUNTER — Ambulatory Visit: Payer: Medicaid Other | Admitting: Hematology

## 2014-05-08 ENCOUNTER — Other Ambulatory Visit: Payer: Medicaid Other

## 2014-05-09 ENCOUNTER — Ambulatory Visit: Payer: Medicaid Other | Admitting: Hematology

## 2014-05-09 ENCOUNTER — Other Ambulatory Visit: Payer: Medicaid Other

## 2014-05-09 ENCOUNTER — Encounter (HOSPITAL_COMMUNITY): Payer: Self-pay | Admitting: *Deleted

## 2014-05-09 ENCOUNTER — Inpatient Hospital Stay (HOSPITAL_COMMUNITY)
Admission: AD | Admit: 2014-05-09 | Discharge: 2014-05-09 | Disposition: A | Discharge status: Home / Self Care | Payer: Medicaid Other | Source: Ambulatory Visit | Attending: Obstetrics and Gynecology | Admitting: Obstetrics and Gynecology

## 2014-05-09 DIAGNOSIS — Z3A38 38 weeks gestation of pregnancy: Secondary | ICD-10-CM | POA: Diagnosis not present

## 2014-05-09 LAB — POCT FERN TEST: POCT Fern Test: POSITIVE

## 2014-05-09 NOTE — Discharge Instructions (Signed)
Braxton Hicks Contractions °Contractions of the uterus can occur throughout pregnancy. Contractions are not always a sign that you are in labor.  °WHAT ARE BRAXTON HICKS CONTRACTIONS?  °Contractions that occur before labor are called Braxton Hicks contractions, or false labor. Toward the end of pregnancy (32-34 weeks), these contractions can develop more often and may become more forceful. This is not true labor because these contractions do not result in opening (dilatation) and thinning of the cervix. They are sometimes difficult to tell apart from true labor because these contractions can be forceful and people have different pain tolerances. You should not feel embarrassed if you go to the hospital with false labor. Sometimes, the only way to tell if you are in true labor is for your health care provider to look for changes in the cervix. °If there are no prenatal problems or other health problems associated with the pregnancy, it is completely safe to be sent home with false labor and await the onset of true labor. °HOW CAN YOU TELL THE DIFFERENCE BETWEEN TRUE AND FALSE LABOR? °False Labor °· The contractions of false labor are usually shorter and not as hard as those of true labor.   °· The contractions are usually irregular.   °· The contractions are often felt in the front of the lower abdomen and in the groin.   °· The contractions may go away when you walk around or change positions while lying down.   °· The contractions get weaker and are shorter lasting as time goes on.   °· The contractions do not usually become progressively stronger, regular, and closer together as with true labor.   °True Labor °1. Contractions in true labor last 30-70 seconds, become very regular, usually become more intense, and increase in frequency.   °2. The contractions do not go away with walking.   °3. The discomfort is usually felt in the top of the uterus and spreads to the lower abdomen and low back.   °4. True labor can  be determined by your health care provider with an exam. This will show that the cervix is dilating and getting thinner.   °WHAT TO REMEMBER °· Keep up with your usual exercises and follow other instructions given by your health care provider.   °· Take medicines as directed by your health care provider.   °· Keep your regular prenatal appointments.   °· Eat and drink lightly if you think you are going into labor.   °· If Braxton Hicks contractions are making you uncomfortable:   °· Change your position from lying down or resting to walking, or from walking to resting.   °· Sit and rest in a tub of warm water.   °· Drink 2-3 glasses of water. Dehydration may cause these contractions.   °· Do slow and deep breathing several times an hour.   °WHEN SHOULD I SEEK IMMEDIATE MEDICAL CARE? °Seek immediate medical care if: °· Your contractions become stronger, more regular, and closer together.   °· You have fluid leaking or gushing from your vagina.   °· You have a fever.   °· You pass blood-tinged mucus.   °· You have vaginal bleeding.   °· You have continuous abdominal pain.   °· You have low back pain that you never had before.   °· You feel your baby's head pushing down and causing pelvic pressure.   °· Your baby is not moving as much as it used to.   °Document Released: 01/11/2005 Document Revised: 01/16/2013 Document Reviewed: 10/23/2012 °[employer]® Patient Information ©2015 [employer]. This information is not intended to replace advice given to you by your health care   provider. Make sure you discuss any questions you have with your health care provider. ° °Fetal Movement Counts °Patient Name: __________________________________________________ Patient Due Date: ____________________ °Performing a fetal movement count is highly recommended in high-risk pregnancies, but it is good for every pregnant woman to do. Your health care provider may ask you to start counting fetal movements at 28 weeks of the pregnancy. Fetal  movements often increase: °· After eating a full meal. °· After physical activity. °· After eating or drinking something sweet or cold. °· At rest. °Pay attention to when you feel the baby is most active. This will help you notice a pattern of your baby's sleep and wake cycles and what factors contribute to an increase in fetal movement. It is important to perform a fetal movement count at the same time each day when your baby is normally most active.  °HOW TO COUNT FETAL MOVEMENTS °5. Find a quiet and comfortable area to sit or lie down on your left side. Lying on your left side provides the best blood and oxygen circulation to your baby. °6. Write down the day and time on a sheet of paper or in a journal. °7. Start counting kicks, flutters, swishes, rolls, or jabs in a 2-hour period. You should feel at least 10 movements within 2 hours. °8. If you do not feel 10 movements in 2 hours, wait 2-3 hours and count again. Look for a change in the pattern or not enough counts in 2 hours. °SEEK MEDICAL CARE IF: °· You feel less than 10 counts in 2 hours, tried twice. °· There is no movement in over an hour. °· The pattern is changing or taking longer each day to reach 10 counts in 2 hours. °· You feel the baby is not moving as he or she usually does. °Date: ____________ Movements: ____________ Start time: ____________ Finish time: ____________  °Date: ____________ Movements: ____________ Start time: ____________ Finish time: ____________ °Date: ____________ Movements: ____________ Start time: ____________ Finish time: ____________ °Date: ____________ Movements: ____________ Start time: ____________ Finish time: ____________ °Date: ____________ Movements: ____________ Start time: ____________ Finish time: ____________ °Date: ____________ Movements: ____________ Start time: ____________ Finish time: ____________ °Date: ____________ Movements: ____________ Start time: ____________ Finish time: ____________ °Date: ____________  Movements: ____________ Start time: ____________ Finish time: ____________  °Date: ____________ Movements: ____________ Start time: ____________ Finish time: ____________ °Date: ____________ Movements: ____________ Start time: ____________ Finish time: ____________ °Date: ____________ Movements: ____________ Start time: ____________ Finish time: ____________ °Date: ____________ Movements: ____________ Start time: ____________ Finish time: ____________ °Date: ____________ Movements: ____________ Start time: ____________ Finish time: ____________ °Date: ____________ Movements: ____________ Start time: ____________ Finish time: ____________ °Date: ____________ Movements: ____________ Start time: ____________ Finish time: ____________  °Date: ____________ Movements: ____________ Start time: ____________ Finish time: ____________ °Date: ____________ Movements: ____________ Start time: ____________ Finish time: ____________ °Date: ____________ Movements: ____________ Start time: ____________ Finish time: ____________ °Date: ____________ Movements: ____________ Start time: ____________ Finish time: ____________ °Date: ____________ Movements: ____________ Start time: ____________ Finish time: ____________ °Date: ____________ Movements: ____________ Start time: ____________ Finish time: ____________ °Date: ____________ Movements: ____________ Start time: ____________ Finish time: ____________  °Date: ____________ Movements: ____________ Start time: ____________ Finish time: ____________ °Date: ____________ Movements: ____________ Start time: ____________ Finish time: ____________ °Date: ____________ Movements: ____________ Start time: ____________ Finish time: ____________ °Date: ____________ Movements: ____________ Start time: ____________ Finish time: ____________ °Date: ____________ Movements: ____________ Start time: ____________ Finish time: ____________ °Date: ____________ Movements: ____________ Start time:  ____________ Finish time: ____________ °Date: ____________ Movements:   ____________ Start time: ____________ Finish time: ____________  °Date: ____________ Movements: ____________ Start time: ____________ Finish time: ____________ °Date: ____________ Movements: ____________ Start time: ____________ Finish time: ____________ °Date: ____________ Movements: ____________ Start time: ____________ Finish time: ____________ °Date: ____________ Movements: ____________ Start time: ____________ Finish time: ____________ °Date: ____________ Movements: ____________ Start time: ____________ Finish time: ____________ °Date: ____________ Movements: ____________ Start time: ____________ Finish time: ____________ °Date: ____________ Movements: ____________ Start time: ____________ Finish time: ____________  °Date: ____________ Movements: ____________ Start time: ____________ Finish time: ____________ °Date: ____________ Movements: ____________ Start time: ____________ Finish time: ____________ °Date: ____________ Movements: ____________ Start time: ____________ Finish time: ____________ °Date: ____________ Movements: ____________ Start time: ____________ Finish time: ____________ °Date: ____________ Movements: ____________ Start time: ____________ Finish time: ____________ °Date: ____________ Movements: ____________ Start time: ____________ Finish time: ____________ °Date: ____________ Movements: ____________ Start time: ____________ Finish time: ____________  °Date: ____________ Movements: ____________ Start time: ____________ Finish time: ____________ °Date: ____________ Movements: ____________ Start time: ____________ Finish time: ____________ °Date: ____________ Movements: ____________ Start time: ____________ Finish time: ____________ °Date: ____________ Movements: ____________ Start time: ____________ Finish time: ____________ °Date: ____________ Movements: ____________ Start time: ____________ Finish time: ____________ °Date:  ____________ Movements: ____________ Start time: ____________ Finish time: ____________ °Date: ____________ Movements: ____________ Start time: ____________ Finish time: ____________  °Date: ____________ Movements: ____________ Start time: ____________ Finish time: ____________ °Date: ____________ Movements: ____________ Start time: ____________ Finish time: ____________ °Date: ____________ Movements: ____________ Start time: ____________ Finish time: ____________ °Date: ____________ Movements: ____________ Start time: ____________ Finish time: ____________ °Date: ____________ Movements: ____________ Start time: ____________ Finish time: ____________ °Date: ____________ Movements: ____________ Start time: ____________ Finish time: ____________ °Document Released: 02/10/2006 Document Revised: 05/28/2013 Document Reviewed: 11/08/2011 °[employer]® Patient Information ©2015 [employer]. This information is not intended to replace advice given to you by your health care provider. Make sure you discuss any questions you have with your health care provider. ° °

## 2014-05-09 NOTE — MAU Note (Signed)
Pt reports  she started having ctx last night. Now ctx q10-15 min. Increased pelvic pressure. Reports some fluid leaking no bleeding . Lost mucus plug 2 days ago. 1cm last week dialled.

## 2014-05-09 NOTE — MAU Note (Signed)
Pt started having contractions last night and they have continued to get worse into this morning.  questionsable ROM.  Denies VB.

## 2014-05-15 ENCOUNTER — Telehealth: Payer: Self-pay | Admitting: Hematology

## 2014-05-15 NOTE — Telephone Encounter (Signed)
gave and printed pt new sched

## 2014-05-16 ENCOUNTER — Other Ambulatory Visit (HOSPITAL_BASED_OUTPATIENT_CLINIC_OR_DEPARTMENT_OTHER): Payer: Medicaid Other

## 2014-05-16 ENCOUNTER — Encounter: Payer: Self-pay | Admitting: Hematology

## 2014-05-16 ENCOUNTER — Telehealth: Payer: Self-pay | Admitting: Hematology

## 2014-05-16 ENCOUNTER — Ambulatory Visit (HOSPITAL_BASED_OUTPATIENT_CLINIC_OR_DEPARTMENT_OTHER): Payer: Medicaid Other | Admitting: Hematology

## 2014-05-16 VITALS — BP 125/73 | HR 93 | Temp 97.8°F | Resp 18 | Ht 66.0 in | Wt 179.9 lb

## 2014-05-16 DIAGNOSIS — O99013 Anemia complicating pregnancy, third trimester: Secondary | ICD-10-CM

## 2014-05-16 DIAGNOSIS — D696 Thrombocytopenia, unspecified: Secondary | ICD-10-CM | POA: Insufficient documentation

## 2014-05-16 DIAGNOSIS — D508 Other iron deficiency anemias: Secondary | ICD-10-CM

## 2014-05-16 LAB — CBC WITH DIFFERENTIAL/PLATELET
BASO%: 0.5 % (ref 0.0–2.0)
Basophils Absolute: 0.1 10*3/uL (ref 0.0–0.1)
EOS ABS: 0.4 10*3/uL (ref 0.0–0.5)
EOS%: 4.1 % (ref 0.0–7.0)
HCT: 34.4 % — ABNORMAL LOW (ref 34.8–46.6)
HGB: 11.2 g/dL — ABNORMAL LOW (ref 11.6–15.9)
LYMPH#: 1.9 10*3/uL (ref 0.9–3.3)
LYMPH%: 18 % (ref 14.0–49.7)
MCH: 26.5 pg (ref 25.1–34.0)
MCHC: 32.5 g/dL (ref 31.5–36.0)
MCV: 81.5 fL (ref 79.5–101.0)
MONO#: 0.8 10*3/uL (ref 0.1–0.9)
MONO%: 7.2 % (ref 0.0–14.0)
NEUT#: 7.5 10*3/uL — ABNORMAL HIGH (ref 1.5–6.5)
NEUT%: 70.2 % (ref 38.4–76.8)
Platelets: 90 10*3/uL — ABNORMAL LOW (ref 145–400)
RBC: 4.23 10*6/uL (ref 3.70–5.45)
RDW: 14.9 % — AB (ref 11.2–14.5)
WBC: 10.6 10*3/uL — AB (ref 3.9–10.3)

## 2014-05-16 NOTE — Telephone Encounter (Signed)
gave and printed appt sched anda vs for pt for April and May °

## 2014-05-16 NOTE — Progress Notes (Signed)
[place]  [phone] Fax:([phone] [place] consult Note   Patient Care Team: Sanjuana Kava, MD as PCP - General (Obstetrics and Gynecology) 05/16/2014  CHIEF COMPLAINTS/PURPOSE OF CONSULTATION:  Thrombocytopenia   Referring physician: Dr. Alwyn Pea  HISTORY OF PRESENTING ILLNESS:  Sue Young 23 y.o. female who is pregnant at [redacted] weeks, is here because of thrombocytopenia.  This is her second pregnancy. She delivered a healthy baby about 3 years ago. She said that she had a normal platelet count until when she was admitted for delivery and her platelet counts was found to be low. She was not able to have epidural because that. She did not have excessive bleeding after the vaginal delivery. The patient her plate count recovered after delivery.  She has been doing well with this pregnancy except one episodes of flu syndromes in Dec 2015 and she was treated in emergency room.   She did notice some skin bruising and mild come bleeding when she brushes her teeth in the past months.  She denied any bleeding episodes including hematochezia, melana, hemoptysis, hematuria or epitaxis. She was not told to have low platelet count until last months when her CBC showed platelet count 26K. she was referred for further evaluation.  INTERIM HISTORY  Sue Young returns for follow-up. She is in week 17 of her pregnancy now. She has been getting weekly CBC checked since her last visit, which showed platelet count in the range of 90-154. She denies any new bruising, or bleeding. She feels well overall.  MEDICAL HISTORY:  Past Medical History  Diagnosis Date  . No pertinent past medical history   . Thrombocytopenia     SURGICAL HISTORY: Past Surgical History  Procedure Laterality Date  . No past surgeries      SOCIAL HISTORY: History   Social History  . Marital Status: Married    Spouse Name: N/A  . Number of Children: N/A  . Years of Education: N/A    Occupational History  . Not on file.   Social History Main Topics  . Smoking status: Never Smoker   . Smokeless tobacco: Never Used  . Alcohol Use: No  . Drug Use: No  . Sexual Activity: Yes    Birth Control/ Protection: None   Other Topics Concern  . Not on file   Social History Narrative    FAMILY HISTORY: Family History  Problem Relation Age of Onset  . Diabetes Mother     ALLERGIES:  has No Known Allergies.  MEDICATIONS:  Current Outpatient Prescriptions  Medication Sig Dispense Refill  . acetaminophen (TYLENOL) 500 MG tablet Take 1,000 mg by mouth every 6 (six) hours as needed for mild pain or headache.    . Prenatal Vit-Fe Fumarate-FA (PRENATAL MULTIVITAMIN) TABS Take 1 tablet by mouth at bedtime.     No current facility-administered medications for this visit.    REVIEW OF SYSTEMS:   Constitutional: Denies fevers, chills or abnormal night sweats Eyes: Denies blurriness of vision, double vision or watery eyes Ears, nose, mouth, throat, and face: Denies mucositis or sore throat Respiratory: Denies cough, dyspnea or wheezes Cardiovascular: Denies palpitation, chest discomfort or lower extremity swelling Gastrointestinal:  Denies nausea, heartburn or change in bowel habits Skin: Denies abnormal skin rashes Lymphatics: Denies new lymphadenopathy or easy bruising Neurological:Denies numbness, tingling or new weaknesses Behavioral/Psych: Mood is stable, no new changes  All other systems were reviewed with the patient and are negative.  PHYSICAL EXAMINATION: ECOG PERFORMANCE STATUS: 0 -  Asymptomatic  Filed Vitals:   05/16/14 1031  BP: 125/73  Pulse: 93  Temp: 97.8 F (36.6 C)  Resp: 18   Filed Weights   05/16/14 1031  Weight: 179 lb 14.4 oz (81.602 kg)    GENERAL:alert, no distress and comfortable SKIN: skin color, texture, turgor are normal, no rashes or significant lesions, no petechia or bruising EYES: normal, conjunctiva are pink and  non-injected, sclera clear OROPHARYNX:no exudate, no erythema and lips, buccal mucosa, and tongue normal  NECK: supple, thyroid normal size, non-tender, without nodularity LYMPH:  no palpable lymphadenopathy in the cervical, axillary or inguinal LUNGS: clear to auscultation and percussion with normal breathing effort HEART: regular rate & rhythm and no murmurs and no lower extremity edema ABDOMEN:abdomen soft, non-tender and normal bowel sounds Musculoskeletal:no cyanosis of digits and no clubbing  PSYCH: alert & oriented x 3 with fluent speech NEURO: no focal motor/sensory deficits  LABORATORY DATA:  I have reviewed the data as listed CBC Latest Ref Rng 05/16/2014 05/01/2014 04/24/2014  WBC 3.9 - 10.3 10e3/uL 10.6(H) 11.4(H) 11.5(H)  Hemoglobin 11.6 - 15.9 g/dL 11.2(L) 11.0(L) 10.8(L)  Hematocrit 34.8 - 46.6 % 34.4(L) 34.3(L) 33.7(L)  Platelets 145 - 400 10e3/uL 90(L) 102(L) 102(L)    CMP Latest Ref Rng 04/10/2014 09/23/2013 07/07/2010  Glucose 70 - 140 mg/dl 101 81 86  BUN 7.0 - 26.0 mg/dL 5.5(L) 10 13  Creatinine 0.6 - 1.1 mg/dL 0.7 0.77 0.72  Sodium 136 - 145 mEq/L 138 139 135  Potassium 3.5 - 5.1 mEq/L 3.6 4.4 4.1  Chloride 96 - 112 mEq/L - 103 104  CO2 22 - 29 mEq/L 22 25 22   Calcium 8.4 - 10.4 mg/dL 8.8 9.3 8.6  Total Protein 6.4 - 8.3 g/dL 7.2 7.2 -  Total Bilirubin 0.20 - 1.20 mg/dL 0.47 <0.2(L) -  Alkaline Phos 40 - 150 U/L 100 52 -  AST 5 - 34 U/L 15 18 -  ALT 0 - 55 U/L 14 19 -     RADIOGRAPHIC STUDIES: I have personally reviewed the radiological images as listed and agreed with the findings in the report. No results found.  ASSESSMENT & PLAN:  24 year old African-American female, without significant past medical history, presented with thrombocytopenia during her pregnancy.  1. Thrombocytopenia, probable related to her pregnancy. -Medical records were obtained from her prior delivery in [place] lab results were missing. -Giving her asymptomatic, no medication,  normal CMP, this is likely pregnancy related some cytopenia or ITP. HELLP syndrome is unlikely given the normal liver function and blood pressure. -I'll also check her liver and spleen after her delivery. hepatitis B and hepatitis C were negative. -Her platelet count today is 90, I recommend close monitoring. I will consider steroids if her platelet count drops to below 50. -If her platelet count drops significantly before delivery, I would consider dexamethasone pulse or IVIG in the hospital, and platelet transfusion.  2.  normocytic hypoproductive anemia  -Her ferritin level is 15 today, iron saturation 15%, TIBC elevated at 546. This is consistent with iron deficient anemia  -I recommend her to take iron pill 1-2 tablets daily.   Follow-up:  I'll see her back after delivery, 5 weeks from now.  CBC weekly for the next two weeks if not delivery   All questions were answered. The patient knows to call the clinic with any problems, questions or concerns. I spent 20 minutes counseling the patient face to face. The total time spent in the appointment was 25 minutes and more  than 50% was on counseling.     Truitt Merle, MD 05/16/2014 10:43 AM

## 2014-05-22 ENCOUNTER — Encounter (HOSPITAL_COMMUNITY): Payer: Self-pay | Admitting: *Deleted

## 2014-05-22 ENCOUNTER — Inpatient Hospital Stay (HOSPITAL_COMMUNITY)
Admission: AD | Admit: 2014-05-22 | Discharge: 2014-05-22 | Disposition: A | Discharge status: Home / Self Care | Payer: Medicaid Other | Source: Ambulatory Visit | Attending: Obstetrics and Gynecology | Admitting: Obstetrics and Gynecology

## 2014-05-22 DIAGNOSIS — O480 Post-term pregnancy: Principal | ICD-10-CM | POA: Diagnosis present

## 2014-05-22 DIAGNOSIS — Z3A40 40 weeks gestation of pregnancy: Secondary | ICD-10-CM

## 2014-05-22 DIAGNOSIS — D693 Immune thrombocytopenic purpura: Secondary | ICD-10-CM | POA: Diagnosis present

## 2014-05-22 DIAGNOSIS — O9912 Other diseases of the blood and blood-forming organs and certain disorders involving the immune mechanism complicating childbirth: Secondary | ICD-10-CM | POA: Diagnosis present

## 2014-05-22 NOTE — MAU Note (Signed)
Pt states she started having pain last night, contractions became more regular today. Pt states she feels like she is open

## 2014-05-22 NOTE — Progress Notes (Signed)
Pt states she has been"pooping all day"

## 2014-05-22 NOTE — MAU Note (Signed)
Pt reports contractions, denies bleeding or ROM.  

## 2014-05-22 NOTE — Discharge Instructions (Signed)
Third Trimester of Pregnancy The third trimester is from week 29 through week 42, months 7 through 9. The third trimester is a time when the fetus is growing rapidly. At the end of the ninth month, the fetus is about 20 inches in length and weighs 6-10 pounds.  BODY CHANGES Your body goes through many changes during pregnancy. The changes vary from woman to woman.   Your weight will continue to increase. You can expect to gain 25-35 pounds (11-16 kg) by the end of the pregnancy.  You may begin to get stretch marks on your hips, abdomen, and breasts.  You may urinate more often because the fetus is moving lower into your pelvis and pressing on your bladder.  You may develop or continue to have heartburn as a result of your pregnancy.  You may develop constipation because certain hormones are causing the muscles that push waste through your intestines to slow down.  You may develop hemorrhoids or swollen, bulging veins (varicose veins).  You may have pelvic pain because of the weight gain and pregnancy hormones relaxing your joints between the bones in your pelvis. Backaches may result from overexertion of the muscles supporting your posture.  You may have changes in your hair. These can include thickening of your hair, rapid growth, and changes in texture. Some women also have hair loss during or after pregnancy, or hair that feels dry or thin. Your hair will most likely return to normal after your baby is born.  Your breasts will continue to grow and be tender. A yellow discharge may leak from your breasts called colostrum.  Your belly button may stick out.  You may feel short of breath because of your expanding uterus.  You may notice the fetus "dropping," or moving lower in your abdomen.  You may have a bloody mucus discharge. This usually occurs a few days to a week before labor begins.  Your cervix becomes thin and soft (effaced) near your due date. WHAT TO EXPECT AT YOUR PRENATAL  EXAMS  You will have prenatal exams every 2 weeks until week 36. Then, you will have weekly prenatal exams. During a routine prenatal visit:  You will be weighed to make sure you and the fetus are growing normally.  Your blood pressure is taken.  Your abdomen will be measured to track your baby's growth.  The fetal heartbeat will be listened to.  Any test results from the previous visit will be discussed.  You may have a cervical check near your due date to see if you have effaced. At around 36 weeks, your caregiver will check your cervix. At the same time, your caregiver will also perform a test on the secretions of the vaginal tissue. This test is to determine if a type of bacteria, Group B streptococcus, is present. Your caregiver will explain this further. Your caregiver may ask you:  What your birth plan is.  How you are feeling.  If you are feeling the baby move.  If you have had any abnormal symptoms, such as leaking fluid, bleeding, severe headaches, or abdominal cramping.  If you have any questions. Other tests or screenings that may be performed during your third trimester include:  Blood tests that check for low iron levels (anemia).  Fetal testing to check the health, activity level, and growth of the fetus. Testing is done if you have certain medical conditions or if there are problems during the pregnancy. FALSE LABOR You may feel small, irregular contractions that  eventually go away. These are called Braxton Hicks contractions, or false labor. Contractions may last for hours, days, or even weeks before true labor sets in. If contractions come at regular intervals, intensify, or become painful, it is best to be seen by your caregiver.  SIGNS OF LABOR   Menstrual-like cramps.  Contractions that are 5 minutes apart or less.  Contractions that start on the top of the uterus and spread down to the lower abdomen and back.  A sense of increased pelvic pressure or back  pain.  A watery or bloody mucus discharge that comes from the vagina. If you have any of these signs before the 37th week of pregnancy, call your caregiver right away. You need to go to the hospital to get checked immediately. HOME CARE INSTRUCTIONS   Avoid all smoking, herbs, alcohol, and unprescribed drugs. These chemicals affect the formation and growth of the baby.  Follow your caregiver's instructions regarding medicine use. There are medicines that are either safe or unsafe to take during pregnancy.  Exercise only as directed by your caregiver. Experiencing uterine cramps is a good sign to stop exercising.  Continue to eat regular, healthy meals.  Wear a good support bra for breast tenderness.  Do not use hot tubs, steam rooms, or saunas.  Wear your seat belt at all times when driving.  Avoid raw meat, uncooked cheese, cat litter boxes, and soil used by cats. These carry germs that can cause birth defects in the baby.  Take your prenatal vitamins.  Try taking a stool softener (if your caregiver approves) if you develop constipation. Eat more high-fiber foods, such as fresh vegetables or fruit and whole grains. Drink plenty of fluids to keep your urine clear or pale yellow.  Take warm sitz baths to soothe any pain or discomfort caused by hemorrhoids. Use hemorrhoid cream if your caregiver approves.  If you develop varicose veins, wear support hose. Elevate your feet for 15 minutes, 3-4 times a day. Limit salt in your diet.  Avoid heavy lifting, wear low heal shoes, and practice good posture.  Rest a lot with your legs elevated if you have leg cramps or low back pain.  Visit your dentist if you have not gone during your pregnancy. Use a soft toothbrush to brush your teeth and be gentle when you floss.  A sexual relationship may be continued unless your caregiver directs you otherwise.  Do not travel far distances unless it is absolutely necessary and only with the approval  of your caregiver.  Take prenatal classes to understand, practice, and ask questions about the labor and delivery.  Make a trial run to the hospital.  Pack your hospital bag.  Prepare the baby's nursery.  Continue to go to all your prenatal visits as directed by your caregiver. SEEK MEDICAL CARE IF:  You are unsure if you are in labor or if your water has broken.  You have dizziness.  You have mild pelvic cramps, pelvic pressure, or nagging pain in your abdominal area.  You have persistent nausea, vomiting, or diarrhea.  You have a bad smelling vaginal discharge.  You have pain with urination. SEEK IMMEDIATE MEDICAL CARE IF:   You have a fever.  You are leaking fluid from your vagina.  You have spotting or bleeding from your vagina.  You have severe abdominal cramping or pain.  You have rapid weight loss or gain.  You have shortness of breath with chest pain.  You notice sudden or extreme swelling  of your face, hands, ankles, feet, or legs.  You have not felt your baby move in over an hour.  You have severe headaches that do not go away with medicine.  You have vision changes. Document Released: 01/05/2001 Document Revised: 01/16/2013 Document Reviewed: 03/14/2012 [facility] Patient Information 2015 [place], [place]. This information is not intended to replace advice given to you by your health care provider. Make sure you discuss any questions you have with your health care provider. Fetal Movement Counts Patient Name: __________________________________________________ Patient Due Date: ____________________ Performing a fetal movement count is highly recommended in high-risk pregnancies, but it is good for every pregnant woman to do. Your health care provider may ask you to start counting fetal movements at 28 weeks of the pregnancy. Fetal movements often increase:  After eating a full meal.  After physical activity.  After eating or drinking something sweet or  cold.  At rest. Pay attention to when you feel the baby is most active. This will help you notice a pattern of your baby's sleep and wake cycles and what factors contribute to an increase in fetal movement. It is important to perform a fetal movement count at the same time each day when your baby is normally most active.  HOW TO COUNT FETAL MOVEMENTS  Find a quiet and comfortable area to sit or lie down on your left side. Lying on your left side provides the best blood and oxygen circulation to your baby.  Write down the day and time on a sheet of paper or in a journal.  Start counting kicks, flutters, swishes, rolls, or jabs in a 2-hour period. You should feel at least 10 movements within 2 hours.  If you do not feel 10 movements in 2 hours, wait 2-3 hours and count again. Look for a change in the pattern or not enough counts in 2 hours. SEEK MEDICAL CARE IF:  You feel less than 10 counts in 2 hours, tried twice.  There is no movement in over an hour.  The pattern is changing or taking longer each day to reach 10 counts in 2 hours.  You feel the baby is not moving as he or she usually does. Date: ____________ Movements: ____________ Start time: ____________ Elizebeth Koller time: ____________  Date: ____________ Movements: ____________ Start time: ____________ Elizebeth Koller time: ____________ Date: ____________ Movements: ____________ Start time: ____________ Elizebeth Koller time: ____________ Date: ____________ Movements: ____________ Start time: ____________ Elizebeth Koller time: ____________ Date: ____________ Movements: ____________ Start time: ____________ Elizebeth Koller time: ____________ Date: ____________ Movements: ____________ Start time: ____________ Elizebeth Koller time: ____________ Date: ____________ Movements: ____________ Start time: ____________ Elizebeth Koller time: ____________ Date: ____________ Movements: ____________ Start time: ____________ Elizebeth Koller time: ____________  Date: ____________ Movements: ____________ Start time:  ____________ Elizebeth Koller time: ____________ Date: ____________ Movements: ____________ Start time: ____________ Elizebeth Koller time: ____________ Date: ____________ Movements: ____________ Start time: ____________ Elizebeth Koller time: ____________ Date: ____________ Movements: ____________ Start time: ____________ Elizebeth Koller time: ____________ Date: ____________ Movements: ____________ Start time: ____________ Elizebeth Koller time: ____________ Date: ____________ Movements: ____________ Start time: ____________ Elizebeth Koller time: ____________ Date: ____________ Movements: ____________ Start time: ____________ Elizebeth Koller time: ____________  Date: ____________ Movements: ____________ Start time: ____________ Elizebeth Koller time: ____________ Date: ____________ Movements: ____________ Start time: ____________ Elizebeth Koller time: ____________ Date: ____________ Movements: ____________ Start time: ____________ Elizebeth Koller time: ____________ Date: ____________ Movements: ____________ Start time: ____________ Elizebeth Koller time: ____________ Date: ____________ Movements: ____________ Start time: ____________ Elizebeth Koller time: ____________ Date: ____________ Movements: ____________ Start time: ____________ Elizebeth Koller time: ____________ Date: ____________ Movements: ____________ Start time: ____________ Elizebeth Koller time:  ____________  Date: ____________ Movements: ____________ Start time: ____________ Elizebeth Koller time: ____________ Date: ____________ Movements: ____________ Start time: ____________ Elizebeth Koller time: ____________ Date: ____________ Movements: ____________ Start time: ____________ Elizebeth Koller time: ____________ Date: ____________ Movements: ____________ Start time: ____________ Elizebeth Koller time: ____________ Date: ____________ Movements: ____________ Start time: ____________ Elizebeth Koller time: ____________ Date: ____________ Movements: ____________ Start time: ____________ Elizebeth Koller time: ____________ Date: ____________ Movements: ____________ Start time: ____________ Elizebeth Koller time: ____________  Date:  ____________ Movements: ____________ Start time: ____________ Elizebeth Koller time: ____________ Date: ____________ Movements: ____________ Start time: ____________ Elizebeth Koller time: ____________ Date: ____________ Movements: ____________ Start time: ____________ Elizebeth Koller time: ____________ Date: ____________ Movements: ____________ Start time: ____________ Elizebeth Koller time: ____________ Date: ____________ Movements: ____________ Start time: ____________ Elizebeth Koller time: ____________ Date: ____________ Movements: ____________ Start time: ____________ Elizebeth Koller time: ____________ Date: ____________ Movements: ____________ Start time: ____________ Elizebeth Koller time: ____________  Date: ____________ Movements: ____________ Start time: ____________ Elizebeth Koller time: ____________ Date: ____________ Movements: ____________ Start time: ____________ Elizebeth Koller time: ____________ Date: ____________ Movements: ____________ Start time: ____________ Elizebeth Koller time: ____________ Date: ____________ Movements: ____________ Start time: ____________ Elizebeth Koller time: ____________ Date: ____________ Movements: ____________ Start time: ____________ Elizebeth Koller time: ____________ Date: ____________ Movements: ____________ Start time: ____________ Elizebeth Koller time: ____________ Date: ____________ Movements: ____________ Start time: ____________ Elizebeth Koller time: ____________  Date: ____________ Movements: ____________ Start time: ____________ Elizebeth Koller time: ____________ Date: ____________ Movements: ____________ Start time: ____________ Elizebeth Koller time: ____________ Date: ____________ Movements: ____________ Start time: ____________ Elizebeth Koller time: ____________ Date: ____________ Movements: ____________ Start time: ____________ Elizebeth Koller time: ____________ Date: ____________ Movements: ____________ Start time: ____________ Elizebeth Koller time: ____________ Date: ____________ Movements: ____________ Start time: ____________ Elizebeth Koller time: ____________ Date: ____________ Movements: ____________ Start  time: ____________ Elizebeth Koller time: ____________  Date: ____________ Movements: ____________ Start time: ____________ Elizebeth Koller time: ____________ Date: ____________ Movements: ____________ Start time: ____________ Elizebeth Koller time: ____________ Date: ____________ Movements: ____________ Start time: ____________ Elizebeth Koller time: ____________ Date: ____________ Movements: ____________ Start time: ____________ Elizebeth Koller time: ____________ Date: ____________ Movements: ____________ Start time: ____________ Elizebeth Koller time: ____________ Date: ____________ Movements: ____________ Start time: ____________ Elizebeth Koller time: ____________ Document Released: 02/10/2006 Document Revised: 05/28/2013 Document Reviewed: 11/08/2011 [employer] Patient Information 2015 [employer]. This information is not intended to replace advice given to you by your health care provider. Make sure you discuss any questions you have with your health care provider.

## 2014-05-22 NOTE — Progress Notes (Signed)
Contractions are rated a 6 pt states her body feels like a 10 when she rates it for comfort

## 2014-05-23 ENCOUNTER — Inpatient Hospital Stay (HOSPITAL_COMMUNITY)
Admission: RE | Admit: 2014-05-23 | Discharge: 2014-05-26 | DRG: 774 | Disposition: A | Discharge status: Home / Self Care | Payer: Medicaid Other | Source: Ambulatory Visit | Attending: Obstetrics and Gynecology | Admitting: Obstetrics and Gynecology

## 2014-05-23 ENCOUNTER — Other Ambulatory Visit (HOSPITAL_BASED_OUTPATIENT_CLINIC_OR_DEPARTMENT_OTHER): Payer: Medicaid Other

## 2014-05-23 ENCOUNTER — Encounter (HOSPITAL_COMMUNITY): Payer: Self-pay

## 2014-05-23 DIAGNOSIS — O480 Post-term pregnancy: Secondary | ICD-10-CM | POA: Diagnosis present

## 2014-05-23 DIAGNOSIS — O99013 Anemia complicating pregnancy, third trimester: Secondary | ICD-10-CM

## 2014-05-23 DIAGNOSIS — D693 Immune thrombocytopenic purpura: Secondary | ICD-10-CM | POA: Diagnosis present

## 2014-05-23 DIAGNOSIS — D696 Thrombocytopenia, unspecified: Secondary | ICD-10-CM | POA: Diagnosis not present

## 2014-05-23 DIAGNOSIS — Z3A40 40 weeks gestation of pregnancy: Secondary | ICD-10-CM | POA: Diagnosis present

## 2014-05-23 DIAGNOSIS — O9912 Other diseases of the blood and blood-forming organs and certain disorders involving the immune mechanism complicating childbirth: Secondary | ICD-10-CM | POA: Diagnosis present

## 2014-05-23 HISTORY — DX: Anemia, unspecified: D64.9

## 2014-05-23 LAB — CBC WITH DIFFERENTIAL/PLATELET
BASO%: 0.7 % (ref 0.0–2.0)
Basophils Absolute: 0.1 10*3/uL (ref 0.0–0.1)
EOS%: 3.3 % (ref 0.0–7.0)
Eosinophils Absolute: 0.4 10*3/uL (ref 0.0–0.5)
HCT: 34 % — ABNORMAL LOW (ref 34.8–46.6)
HGB: 11 g/dL — ABNORMAL LOW (ref 11.6–15.9)
LYMPH%: 14.8 % (ref 14.0–49.7)
MCH: 26.4 pg (ref 25.1–34.0)
MCHC: 32.5 g/dL (ref 31.5–36.0)
MCV: 81.3 fL (ref 79.5–101.0)
MONO#: 0.7 10*3/uL (ref 0.1–0.9)
MONO%: 6.1 % (ref 0.0–14.0)
NEUT#: 8.7 10*3/uL — ABNORMAL HIGH (ref 1.5–6.5)
NEUT%: 75.1 % (ref 38.4–76.8)
Platelets: 81 10*3/uL — ABNORMAL LOW (ref 145–400)
RBC: 4.18 10*6/uL (ref 3.70–5.45)
RDW: 14.2 % (ref 11.2–14.5)
WBC: 11.6 10*3/uL — ABNORMAL HIGH (ref 3.9–10.3)
lymph#: 1.7 10*3/uL (ref 0.9–3.3)

## 2014-05-23 LAB — TYPE AND SCREEN
ABO/RH(D): B POS
ANTIBODY SCREEN: NEGATIVE

## 2014-05-23 LAB — CBC
HCT: 34 % — ABNORMAL LOW (ref 36.0–46.0)
HEMOGLOBIN: 11.8 g/dL — AB (ref 12.0–15.0)
MCH: 27.8 pg (ref 26.0–34.0)
MCHC: 34.7 g/dL (ref 30.0–36.0)
MCV: 80.2 fL (ref 78.0–100.0)
PLATELETS: 101 10*3/uL — AB (ref 150–400)
RBC: 4.24 MIL/uL (ref 3.87–5.11)
RDW: 14.4 % (ref 11.5–15.5)
WBC: 11.9 10*3/uL — ABNORMAL HIGH (ref 4.0–10.5)

## 2014-05-23 MED ORDER — OXYTOCIN 40 UNITS IN LACTATED RINGERS INFUSION - SIMPLE MED
62.5000 mL/h | INTRAVENOUS | Status: DC
  Administered 2014-05-24: 62.5 mL/h via INTRAVENOUS
  Filled 2014-05-23: qty 1000

## 2014-05-23 MED ORDER — TERBUTALINE SULFATE 1 MG/ML IJ SOLN: 0.2500 mg | Freq: Once | INTRAMUSCULAR | Status: AC | PRN

## 2014-05-23 MED ORDER — CITRIC ACID-SODIUM CITRATE 334-500 MG/5ML PO SOLN: 30.0000 mL | ORAL | Status: DC | PRN

## 2014-05-23 MED ORDER — ACETAMINOPHEN 325 MG PO TABS: 650.0000 mg | ORAL_TABLET | ORAL | Status: DC | PRN

## 2014-05-23 MED ORDER — ONDANSETRON HCL 4 MG/2ML IJ SOLN: 4.0000 mg | Freq: Four times a day (QID) | INTRAMUSCULAR | Status: DC | PRN

## 2014-05-23 MED ORDER — LIDOCAINE HCL (PF) 1 % IJ SOLN
30.0000 mL | INTRAMUSCULAR | Status: AC | PRN
  Administered 2014-05-24: 30 mL via SUBCUTANEOUS
  Filled 2014-05-23: qty 30

## 2014-05-23 MED ORDER — MISOPROSTOL 25 MCG QUARTER TABLET
25.0000 ug | ORAL_TABLET | ORAL | Status: DC | PRN
  Administered 2014-05-23: 25 ug via VAGINAL
  Administered 2014-05-24: 1000 ug via VAGINAL
  Administered 2014-05-24: 25 ug via VAGINAL
  Filled 2014-05-23 (×2): qty 0.25

## 2014-05-23 MED ORDER — OXYTOCIN BOLUS FROM INFUSION
500.0000 mL | INTRAVENOUS | Status: DC
  Administered 2014-05-24: 500 mL via INTRAVENOUS

## 2014-05-23 MED ORDER — OXYCODONE-ACETAMINOPHEN 5-325 MG PO TABS: 1.0000 | ORAL_TABLET | ORAL | Status: DC | PRN

## 2014-05-23 MED ORDER — OXYCODONE-ACETAMINOPHEN 5-325 MG PO TABS: 2.0000 | ORAL_TABLET | ORAL | Status: DC | PRN

## 2014-05-23 MED ORDER — LACTATED RINGERS IV SOLN
INTRAVENOUS | Status: DC
  Administered 2014-05-23 – 2014-05-24 (×2): via INTRAVENOUS

## 2014-05-23 MED ORDER — LACTATED RINGERS IV SOLN: 500.0000 mL | INTRAVENOUS | Status: DC | PRN

## 2014-05-23 NOTE — Progress Notes (Signed)
Dr Jillyn Hidden at bedside discussing plan for epidural if the patient decides to get one due to low platelet count

## 2014-05-24 ENCOUNTER — Encounter (HOSPITAL_COMMUNITY): Payer: Self-pay

## 2014-05-24 DIAGNOSIS — O480 Post-term pregnancy: Secondary | ICD-10-CM | POA: Diagnosis present

## 2014-05-24 LAB — RPR: RPR: NONREACTIVE

## 2014-05-24 LAB — CBC
HCT: 33.4 % — ABNORMAL LOW (ref 36.0–46.0)
Hemoglobin: 11.2 g/dL — ABNORMAL LOW (ref 12.0–15.0)
MCH: 27.2 pg (ref 26.0–34.0)
MCHC: 33.5 g/dL (ref 30.0–36.0)
MCV: 81.1 fL (ref 78.0–100.0)
PLATELETS: 101 10*3/uL — AB (ref 150–400)
RBC: 4.12 MIL/uL (ref 3.87–5.11)
RDW: 14.6 % (ref 11.5–15.5)
WBC: 16.5 10*3/uL — AB (ref 4.0–10.5)

## 2014-05-24 MED ORDER — LANOLIN HYDROUS EX OINT: TOPICAL_OINTMENT | CUTANEOUS | Status: DC | PRN

## 2014-05-24 MED ORDER — OXYCODONE-ACETAMINOPHEN 5-325 MG PO TABS: 2.0000 | ORAL_TABLET | ORAL | Status: DC | PRN

## 2014-05-24 MED ORDER — TETANUS-DIPHTH-ACELL PERTUSSIS 5-2.5-18.5 LF-MCG/0.5 IM SUSP
0.5000 mL | Freq: Once | INTRAMUSCULAR | Status: AC
  Administered 2014-05-25: 0.5 mL via INTRAMUSCULAR
  Filled 2014-05-24 (×2): qty 0.5

## 2014-05-24 MED ORDER — OXYCODONE-ACETAMINOPHEN 5-325 MG PO TABS
1.0000 | ORAL_TABLET | ORAL | Status: DC | PRN
  Administered 2014-05-25: 1 via ORAL
  Filled 2014-05-24: qty 1

## 2014-05-24 MED ORDER — DIBUCAINE 1 % RE OINT
1.0000 "application " | TOPICAL_OINTMENT | RECTAL | Status: DC | PRN
  Administered 2014-05-25: 1 via RECTAL
  Filled 2014-05-24 (×2): qty 28

## 2014-05-24 MED ORDER — BENZOCAINE-MENTHOL 20-0.5 % EX AERO
1.0000 "application " | INHALATION_SPRAY | CUTANEOUS | Status: DC | PRN
  Administered 2014-05-24: 1 via TOPICAL
  Filled 2014-05-24 (×2): qty 56

## 2014-05-24 MED ORDER — DIPHENHYDRAMINE HCL 25 MG PO CAPS: 25.0000 mg | ORAL_CAPSULE | Freq: Four times a day (QID) | ORAL | Status: DC | PRN

## 2014-05-24 MED ORDER — ONDANSETRON HCL 4 MG/2ML IJ SOLN: 4.0000 mg | INTRAMUSCULAR | Status: DC | PRN

## 2014-05-24 MED ORDER — WITCH HAZEL-GLYCERIN EX PADS
1.0000 "application " | MEDICATED_PAD | CUTANEOUS | Status: DC | PRN
  Administered 2014-05-25: 1 via TOPICAL

## 2014-05-24 MED ORDER — SENNOSIDES-DOCUSATE SODIUM 8.6-50 MG PO TABS
2.0000 | ORAL_TABLET | ORAL | Status: DC
  Administered 2014-05-25 (×2): 2 via ORAL
  Filled 2014-05-24 (×2): qty 2

## 2014-05-24 MED ORDER — SIMETHICONE 80 MG PO CHEW: 80.0000 mg | CHEWABLE_TABLET | ORAL | Status: DC | PRN

## 2014-05-24 MED ORDER — MISOPROSTOL 200 MCG PO TABS
ORAL_TABLET | ORAL | Status: AC
  Filled 2014-05-24: qty 5

## 2014-05-24 MED ORDER — IBUPROFEN 600 MG PO TABS
600.0000 mg | ORAL_TABLET | Freq: Four times a day (QID) | ORAL | Status: DC
  Administered 2014-05-24 – 2014-05-25 (×3): 600 mg via ORAL
  Filled 2014-05-24 (×6): qty 1

## 2014-05-24 MED ORDER — ONDANSETRON HCL 4 MG PO TABS: 4.0000 mg | ORAL_TABLET | ORAL | Status: DC | PRN

## 2014-05-24 MED ORDER — ACETAMINOPHEN 325 MG PO TABS: 650.0000 mg | ORAL_TABLET | ORAL | Status: DC | PRN

## 2014-05-24 MED ORDER — PRENATAL MULTIVITAMIN CH
1.0000 | ORAL_TABLET | Freq: Every day | ORAL | Status: DC
  Administered 2014-05-25 – 2014-05-26 (×2): 1 via ORAL
  Filled 2014-05-24 (×2): qty 1

## 2014-05-24 NOTE — H&P (Signed)
24 y.o. [redacted]w[redacted]d  G2P1001 comes in for induction.  Otherwise has good fetal movement and no bleeding.  Past Medical History  Diagnosis Date  . No pertinent past medical history   . Thrombocytopenia   . Anemia     Past Surgical History  Procedure Laterality Date  . No past surgeries      OB History  Gravida Para Term Preterm AB SAB TAB Ectopic Multiple Living  2 1 1       1     # Outcome Date GA Lbr Len/2nd Weight Sex Delivery Anes PTL Lv  2 Current           1 Term               History   Social History  . Marital Status: Married    Spouse Name: N/A  . Number of Children: N/A  . Years of Education: N/A   Occupational History  . Not on file.   Social History Main Topics  . Smoking status: Never Smoker   . Smokeless tobacco: Never Used  . Alcohol Use: No  . Drug Use: No  . Sexual Activity: Yes    Birth Control/ Protection: None   Other Topics Concern  . Not on file   Social History Narrative   Review of patient's allergies indicates no known allergies.    Prenatal Transfer Tool  Maternal Diabetes: No Genetic Screening: Normal Maternal Ultrasounds/Referrals: Normal Fetal Ultrasounds or other Referrals:  None Maternal Substance Abuse:  No Significant Maternal Medications:  None Significant Maternal Lab Results: None  Other PNC: uncomplicated except for thrombocytopenia.  Pt's plts were at one point as low as 26 but have been stable from 80-101 recently.  Plts this am are 101.    Filed Vitals:   05/24/14 0302  BP: 121/75  Pulse: 74  Temp: 97.4 F (36.3 C)  Resp: 20     Lungs/Cor:  NAD Abdomen:  soft, gravid Ex:  no cords, erythema SVE:  1.5/70/-2 per nurse FHTs:  130, good STV, NST R Toco:  qocc  Results for orders placed or performed during the hospital encounter of 05/23/14 (from the past 24 hour(s))  CBC     Status: Abnormal   Collection Time: 05/23/14  8:25 PM  Result Value Ref Range   WBC 11.9 (H) 4.0 - 10.5 K/uL   RBC 4.24 3.87 - 5.11  MIL/uL   Hemoglobin 11.8 (L) 12.0 - 15.0 g/dL   HCT 34.0 (L) 36.0 - 46.0 %   MCV 80.2 78.0 - 100.0 fL   MCH 27.8 26.0 - 34.0 pg   MCHC 34.7 30.0 - 36.0 g/dL   RDW 14.4 11.5 - 15.5 %   Platelets 101 (L) 150 - 400 K/uL  Type and screen     Status: None   Collection Time: 05/23/14  8:25 PM  Result Value Ref Range   ABO/RH(D) B POS    Antibody Screen NEG    Sample Expiration 05/26/2014    A/P   Term induction for postdates; pt has been seeing Heme for thrombocytopenia.  Plt stable for now but pt declined dry cath.  GBS neg.  Sue Young A

## 2014-05-24 NOTE — Progress Notes (Signed)
Mild discomfort w ctx  Toco: q2-4, irregular EFM: 150s, mod var, + accels SVE: 3/60/-2, AROM clear fluid  G2P1 @ [redacted]w[redacted]d w IOL 2/2 postdates and thrombocytopenia --IOL: s/p AROM, start pitocin in 1 hr prn --Declines epidural --Thrombocytopenia: 101 on admit, s/p heme consult this pregnancy.  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  --FSR, vtx 5.5-6#

## 2014-05-25 LAB — CBC
HCT: 29.2 % — ABNORMAL LOW (ref 36.0–46.0)
Hemoglobin: 9.9 g/dL — ABNORMAL LOW (ref 12.0–15.0)
MCH: 27 pg (ref 26.0–34.0)
MCHC: 33.9 g/dL (ref 30.0–36.0)
MCV: 79.8 fL (ref 78.0–100.0)
PLATELETS: 76 10*3/uL — AB (ref 150–400)
RBC: 3.66 MIL/uL — AB (ref 3.87–5.11)
RDW: 14.5 % (ref 11.5–15.5)
WBC: 14.1 10*3/uL — ABNORMAL HIGH (ref 4.0–10.5)

## 2014-05-25 NOTE — Progress Notes (Signed)
Patient is doing well.  She is ambulating, voiding, tolerating PO.  Pain control is good.  Lochia is appropriate  Filed Vitals:   05/24/14 1401 05/24/14 1500 05/24/14 1600 05/25/14 0558  BP: 134/76 150/73 132/65 122/26  Pulse: 83 77 78 64  Temp:  97.8 F (36.6 C) 99.2 F (37.3 C) 98.2 F (36.8 C)  TempSrc:  Oral Oral Oral  Resp: 18 18 18 18   Height:      Weight:      SpO2:   99%     NAD Fundus firm Ext:  Trace edema  Lab Results  Component Value Date   WBC 14.1* 05/25/2014   HGB 9.9* 05/25/2014   HCT 29.2* 05/25/2014   MCV 79.8 05/25/2014   PLT 76* 05/25/2014    --/--/B POS (04/28 2025)/RImmune  A/P 24 y.o. G2P2001 PPD# 1 . Routine care.   Thrombocytopenia: plt 76 today, bleeding appropriate.  Will f/u as outpatient w heme  Desires d/c tomorrow.  Elects for circ in office as outpatient.    Sue Young

## 2014-05-26 MED ORDER — IBUPROFEN 600 MG PO TABS: 600.0000 mg | ORAL_TABLET | Freq: Four times a day (QID) | ORAL | Status: DC | PRN

## 2014-05-26 MED ORDER — OXYCODONE-ACETAMINOPHEN 5-325 MG PO TABS: 1.0000 | ORAL_TABLET | Freq: Four times a day (QID) | ORAL | Status: DC | PRN

## 2014-05-26 NOTE — Progress Notes (Signed)
Patient is doing well.  She is ambulating, voiding, tolerating PO.  Pain control is good.  Lochia is appropriate  Filed Vitals:   05/24/14 1600 05/25/14 0558 05/25/14 1816 05/26/14 0612  BP: 132/65 122/26 121/73 128/67  Pulse: 78 64 68 72  Temp: 99.2 F (37.3 C) 98.2 F (36.8 C) 98.5 F (36.9 C) 98 F (36.7 C)  TempSrc: Oral Oral Oral Oral  Resp: 18 18 20 20   Height:      Weight:      SpO2: 99%       NAD Fundus firm Ext:  Trace edema  Lab Results  Component Value Date   WBC 14.1* 05/25/2014   HGB 9.9* 05/25/2014   HCT 29.2* 05/25/2014   MCV 79.8 05/25/2014   PLT 76* 05/25/2014    --/--/B POS (04/28 2025)/RImmune  A/P 24 y.o. G2P2001 PPD# 2 . Routine care.   Thrombocytopenia: plt 76 today, bleeding appropriate.  Will f/u as outpatient w heme (appt scheduled) Meeting all goals.  D/C today.   Elects for circ in office as outpatient.    [place]

## 2014-05-26 NOTE — Discharge Instructions (Signed)

## 2014-05-26 NOTE — Discharge Summary (Signed)
Obstetric Discharge Summary Reason for Admission: induction of labor Prenatal Procedures: none Intrapartum Procedures: spontaneous vaginal delivery Postpartum Procedures: none Complications-Operative and Postpartum: 2 degree perineal laceration HEMOGLOBIN  Date Value Ref Range Status  05/25/2014 9.9* 12.0 - 15.0 g/dL Final   HGB  Date Value Ref Range Status  05/23/2014 11.0* 11.6 - 15.9 g/dL Final   HCT  Date Value Ref Range Status  05/25/2014 29.2* 36.0 - 46.0 % Final  05/23/2014 34.0* 34.8 - 46.6 % Final    Physical Exam:  General: alert, cooperative and appears stated age Lochia: appropriate Uterine Fundus: firm DVT Evaluation: No evidence of DVT seen on physical exam. Negative Homan's sign.  Discharge Diagnoses: Term Pregnancy-delivered  Discharge Information: Date: 05/26/2014 Activity: pelvic rest Diet: routine Medications: PNV, Ibuprofen and Percocet Condition: stable Instructions: refer to practice specific booklet Discharge to: home Follow-up Information    Follow up with [facility] GE[facility] Carlis Abbott, MD In 4 weeks.   Specialty:  Obstetrics   Contact information:   [place] [address] [phone]       Newborn Data: Live born female  Birth Weight: 6 lb 12.5 oz (3075 g) APGAR: 8, 9  Home with mother.  [place] 05/26/2014, 9:05 AM

## 2014-05-26 NOTE — Lactation Note (Signed)
This note was copied from the chart of [place]. Lactation Consultation Note: Experienced BF mom reports baby is latching well with no pain except first few minutes which eases off. Reports he is feeding a lot and breasts are feeling fuller this morning. Asking about Lanolin- used it with last baby. Encouraged to rub EBM into nipples instead. Has first years pump at home. Has WIC. Is going to be home with the baby so WIC told her she could probably not get pump from them, BF brochure given with resources for support after DC. Reviewed BFSG and OP appointments as resources for support. No questions at present. To call prn  Patient Name: Boy Karyn Brull [id number] Date: 05/26/2014 Reason for consult: Initial assessment   Maternal Data Formula Feeding for Exclusion: No Does the patient have breastfeeding experience prior to this delivery?: Yes  Feeding    LATCH Score/Interventions                      Lactation Tools Discussed/Used WIC Program: Yes   Consult Status Consult Status: Complete    Truddie Crumble 05/26/2014, 11:16 AM

## 2014-05-27 NOTE — Progress Notes (Signed)
Post discharge chart review completed.  

## 2014-05-30 ENCOUNTER — Other Ambulatory Visit: Payer: Medicaid Other

## 2014-06-21 ENCOUNTER — Other Ambulatory Visit: Payer: Medicaid Other

## 2014-06-21 ENCOUNTER — Ambulatory Visit: Payer: Medicaid Other | Admitting: Hematology

## 2015-08-03 ENCOUNTER — Encounter (HOSPITAL_COMMUNITY): Payer: Self-pay

## 2015-08-03 ENCOUNTER — Inpatient Hospital Stay (HOSPITAL_COMMUNITY)
Admission: AD | Admit: 2015-08-03 | Discharge: 2015-08-03 | Disposition: A | Discharge status: Home / Self Care | Payer: Medicaid Other | Source: Ambulatory Visit | Attending: Obstetrics and Gynecology | Admitting: Obstetrics and Gynecology

## 2015-08-03 DIAGNOSIS — N760 Acute vaginitis: Secondary | ICD-10-CM | POA: Insufficient documentation

## 2015-08-03 DIAGNOSIS — B9689 Other specified bacterial agents as the cause of diseases classified elsewhere: Secondary | ICD-10-CM

## 2015-08-03 DIAGNOSIS — D696 Thrombocytopenia, unspecified: Secondary | ICD-10-CM | POA: Insufficient documentation

## 2015-08-03 DIAGNOSIS — A499 Bacterial infection, unspecified: Secondary | ICD-10-CM

## 2015-08-03 DIAGNOSIS — N390 Urinary tract infection, site not specified: Secondary | ICD-10-CM

## 2015-08-03 HISTORY — DX: Acute upper respiratory infection, unspecified: J06.9

## 2015-08-03 HISTORY — DX: Gonococcal infection, unspecified: A54.9

## 2015-08-03 HISTORY — DX: Other specified bacterial agents as the cause of diseases classified elsewhere: B96.89

## 2015-08-03 HISTORY — DX: Chlamydial infection, unspecified: A74.9

## 2015-08-03 HISTORY — DX: Acute vaginitis: N76.0

## 2015-08-03 LAB — URINE MICROSCOPIC-ADD ON

## 2015-08-03 LAB — URINALYSIS, ROUTINE W REFLEX MICROSCOPIC
Bilirubin Urine: NEGATIVE
Glucose, UA: NEGATIVE mg/dL
Hgb urine dipstick: NEGATIVE
Ketones, ur: NEGATIVE mg/dL
Nitrite: NEGATIVE
PROTEIN: NEGATIVE mg/dL
Specific Gravity, Urine: 1.025 (ref 1.005–1.030)
pH: 6 (ref 5.0–8.0)

## 2015-08-03 LAB — WET PREP, GENITAL
Sperm: NONE SEEN
Trich, Wet Prep: NONE SEEN
Yeast Wet Prep HPF POC: NONE SEEN

## 2015-08-03 LAB — POCT PREGNANCY, URINE: Preg Test, Ur: NEGATIVE

## 2015-08-03 MED ORDER — SULFAMETHOXAZOLE-TRIMETHOPRIM 800-160 MG PO TABS: 1.0000 | ORAL_TABLET | Freq: Two times a day (BID) | ORAL | Status: AC

## 2015-08-03 MED ORDER — METRONIDAZOLE 500 MG PO TABS: 500.0000 mg | ORAL_TABLET | Freq: Two times a day (BID) | ORAL | Status: DC

## 2015-08-03 NOTE — MAU Provider Note (Signed)
History     CSN: [id number]  Arrival date and time: 08/03/15 1556   First Provider Initiated Contact with Patient 08/03/15 1645      Chief Complaint  Patient presents with  . Dysuria  . Abdominal Pain  . Vaginal Discharge   HPI    [name] is a 25 y.o. female [phone] here with dysuria X 2 days, abdominal pain and vaginal discharge.  The abdominal pain is located in the center of her abdomen and radiates toward her lower back. The pain comes and goes. The pain is rated at a 6/10. She has not taken anything for the pain.   The vaginal discharge started 4 days ago. The discharge is yellow and has an odor. No new sexual partners.   OB History    Gravida Para Term Preterm AB TAB SAB Ectopic Multiple Living   2 2 2       0 2      Past Medical History  Diagnosis Date  . No pertinent past medical history   . Thrombocytopenia ([place])   . Anemia   . Chlamydia   . Gonorrhea   . BV (bacterial vaginosis)   . Recurrent upper respiratory infection (URI)     Past Surgical History  Procedure Laterality Date  . No past surgeries      Family History  Problem Relation Age of Onset  . Diabetes Mother     Social History  Substance Use Topics  . Smoking status: Never Smoker   . Smokeless tobacco: Never Used  . Alcohol Use: No    Allergies: No Known Allergies  Prescriptions prior to admission  Medication Sig Dispense Refill Last Dose  . levonorgestrel (MIRENA) 20 MCG/24HR IUD 1 each by Intrauterine route once. Reported on 08/03/2015      Results for orders placed or performed during the hospital encounter of 08/03/15 (from the past 48 hour(s))  Urinalysis, Routine w reflex microscopic (not at [facility])     Status: Abnormal   Collection Time: 08/03/15  4:00 PM  Result Value Ref Range   Color, Urine YELLOW YELLOW   APPearance HAZY (A) CLEAR   Specific Gravity, Urine 1.025 1.005 - 1.030   pH 6.0 5.0 - 8.0   Glucose, UA NEGATIVE NEGATIVE mg/dL   Hgb urine dipstick  NEGATIVE NEGATIVE   Bilirubin Urine NEGATIVE NEGATIVE   Ketones, ur NEGATIVE NEGATIVE mg/dL   Protein, ur NEGATIVE NEGATIVE mg/dL   Nitrite NEGATIVE NEGATIVE   Leukocytes, UA SMALL (A) NEGATIVE  Urine microscopic-add on     Status: Abnormal   Collection Time: 08/03/15  4:00 PM  Result Value Ref Range   Squamous Epithelial / LPF 0-5 (A) NONE SEEN   WBC, UA 6-30 0 - 5 WBC/hpf   RBC / HPF 0-5 0 - 5 RBC/hpf   Bacteria, UA MANY (A) NONE SEEN  Pregnancy, urine POC     Status: None   Collection Time: 08/03/15  4:16 PM  Result Value Ref Range   Preg Test, Ur NEGATIVE NEGATIVE    Comment:        THE SENSITIVITY OF THIS METHODOLOGY IS >24 mIU/mL   Wet prep, genital     Status: Abnormal   Collection Time: 08/03/15  5:00 PM  Result Value Ref Range   Yeast Wet Prep HPF POC NONE SEEN NONE SEEN   Trich, Wet Prep NONE SEEN NONE SEEN   Clue Cells Wet Prep HPF POC PRESENT (A) NONE SEEN   WBC, Wet Prep  HPF POC FEW (A) NONE SEEN    Comment: MODERATE BACTERIA SEEN   Sperm NONE SEEN     Review of Systems  Constitutional: Negative for fever and chills.  Gastrointestinal: Positive for abdominal pain. Negative for nausea and vomiting.  Genitourinary: Positive for dysuria, urgency and frequency. Negative for hematuria and flank pain.   Physical Exam   Blood pressure 118/64, pulse 72, temperature 97.9 F (36.6 C), temperature source Oral, resp. rate 16, height 5\' 6"  (1.676 m), weight 134 lb 0.6 oz (60.8 kg), last menstrual period 07/20/2015, unknown if currently breastfeeding.  Physical Exam  Constitutional: She appears well-developed and well-nourished. No distress.  HENT:  Head: Normocephalic.  Respiratory: Effort normal.  GI: There is tenderness in the right lower quadrant and suprapubic area. There is no rigidity, no rebound and no guarding.  Genitourinary:  Speculum exam: Vagina - Small amount of creamy, yellow discharge, mild odor Cervix - No contact bleeding Bimanual exam: Cervix  closed, no CMT  Uterus non tender, normal size Adnexa non tender, no masses bilaterally GC/Chlam, wet prep done Chaperone present for exam.  Skin: She is not diaphoretic.    MAU Course  Procedures  None  MDM  Wet prep GC Discussed patient with Dr. Rogue Bussing   Assessment and Plan   A:  1. Bacterial vaginosis   2. Acute UTI     P:  Discharge home in stable condition Rx: Bactrim, Flagyl  Urine culture pending  Return to MAU for emergencies  Follow up with Dr. Rogue Bussing  Condoms always   Lezlie Lye, NP 08/03/2015 7:25 PM

## 2015-08-03 NOTE — Discharge Instructions (Signed)
Bacterial Vaginosis °Bacterial vaginosis is a vaginal infection that occurs when the normal balance of bacteria in the vagina is disrupted. It results from an overgrowth of certain bacteria. This is the most common vaginal infection in women of childbearing age. Treatment is important to prevent complications, especially in pregnant women, as it can cause a premature delivery. °CAUSES  °Bacterial vaginosis is caused by an increase in harmful bacteria that are normally present in smaller amounts in the vagina. Several different kinds of bacteria can cause bacterial vaginosis. However, the reason that the condition develops is not fully understood. °RISK FACTORS °Certain activities or behaviors can put you at an increased risk of developing bacterial vaginosis, including: °· Having a new sex partner or multiple sex partners. °· Douching. °· Using an intrauterine device (IUD) for contraception. °Women do not get bacterial vaginosis from toilet seats, bedding, swimming pools, or contact with objects around them. °SIGNS AND SYMPTOMS  °Some women with bacterial vaginosis have no signs or symptoms. Common symptoms include: °· Grey vaginal discharge. °· A fishlike odor with discharge, especially after sexual intercourse. °· Itching or burning of the vagina and vulva. °· Burning or pain with urination. °DIAGNOSIS  °Your health care provider will take a medical history and examine the vagina for signs of bacterial vaginosis. A sample of vaginal fluid may be taken. Your health care provider will look at this sample under a microscope to check for bacteria and abnormal cells. A vaginal pH test may also be done.  °TREATMENT  °Bacterial vaginosis may be treated with antibiotic medicines. These may be given in the form of a pill or a vaginal cream. A second round of antibiotics may be prescribed if the condition comes back after treatment. Because bacterial vaginosis increases your risk for sexually transmitted diseases, getting  treated can help reduce your risk for chlamydia, gonorrhea, HIV, and herpes. °HOME CARE INSTRUCTIONS  °· Only take over-the-counter or prescription medicines as directed by your health care provider. °· If antibiotic medicine was prescribed, take it as directed. Make sure you finish it even if you start to feel better. °· Tell all sexual partners that you have a vaginal infection. They should see their health care provider and be treated if they have problems, such as a mild rash or itching. °· During treatment, it is important that you follow these instructions: °· Avoid sexual activity or use condoms correctly. °· Do not douche. °· Avoid alcohol as directed by your health care provider. °· Avoid breastfeeding as directed by your health care provider. °SEEK MEDICAL CARE IF:  °· Your symptoms are not improving after 3 days of treatment. °· You have increased discharge or pain. °· You have a fever. °MAKE SURE YOU:  °· Understand these instructions. °· Will watch your condition. °· Will get help right away if you are not doing well or get worse. °FOR MORE INFORMATION  °Centers for Disease Control and Prevention, Division of STD Prevention: www.cdc.gov/std °American Sexual Health Association (ASHA): www.ashastd.org  °  °This information is not intended to replace advice given to you by your health care provider. Make sure you discuss any questions you have with your health care provider. °  °Document Released: 01/11/2005 Document Revised: 02/01/2014 Document Reviewed: 08/23/2012 °Elsevier Interactive Patient Education ©2016 Elsevier Inc[employer] ° °Urinary Tract Infection °Urinary tract infections (UTIs) can develop anywhere along your urinary tract. Your urinary tract is your body's drainage system for removing wastes and extra water. Your urinary tract includes two kidneys, two ureters,   a bladder, and a urethra. Your kidneys are a pair of bean-shaped organs. Each kidney is about the size of your fist. They are located below  your ribs, one on each side of your spine. °CAUSES °Infections are caused by microbes, which are microscopic organisms, including fungi, viruses, and bacteria. These organisms are so small that they can only be seen through a microscope. Bacteria are the microbes that most commonly cause UTIs. °SYMPTOMS  °Symptoms of UTIs may vary by age and gender of the patient and by the location of the infection. Symptoms in young women typically include a frequent and intense urge to urinate and a painful, burning feeling in the bladder or urethra during urination. Older women and men are more likely to be tired, shaky, and weak and have muscle aches and abdominal pain. A fever may mean the infection is in your kidneys. Other symptoms of a kidney infection include pain in your back or sides below the ribs, nausea, and vomiting. °DIAGNOSIS °To diagnose a UTI, your caregiver will ask you about your symptoms. Your caregiver will also ask you to provide a urine sample. The urine sample will be tested for bacteria and white blood cells. White blood cells are made by your body to help fight infection. °TREATMENT  °Typically, UTIs can be treated with medication. Because most UTIs are caused by a bacterial infection, they usually can be treated with the use of antibiotics. The choice of antibiotic and length of treatment depend on your symptoms and the type of bacteria causing your infection. °HOME CARE INSTRUCTIONS °· If you were prescribed antibiotics, take them exactly as your caregiver instructs you. Finish the medication even if you feel better after you have only taken some of the medication. °· Drink enough water and fluids to keep your urine clear or pale yellow. °· Avoid caffeine, tea, and carbonated beverages. They tend to irritate your bladder. °· Empty your bladder often. Avoid holding urine for long periods of time. °· Empty your bladder before and after sexual intercourse. °· After a bowel movement, women should cleanse  from front to back. Use each tissue only once. °SEEK MEDICAL CARE IF:  °· You have back pain. °· You develop a fever. °· Your symptoms do not begin to resolve within 3 days. °SEEK IMMEDIATE MEDICAL CARE IF:  °· You have severe back pain or lower abdominal pain. °· You develop chills. °· You have nausea or vomiting. °· You have continued burning or discomfort with urination. °MAKE SURE YOU:  °· Understand these instructions. °· Will watch your condition. °· Will get help right away if you are not doing well or get worse. °  °This information is not intended to replace advice given to you by your health care provider. Make sure you discuss any questions you have with your health care provider. °  °Document Released: 10/21/2004 Document Revised: 10/02/2014 Document Reviewed: 02/19/2011 °Elsevier Interactive Patient Education ©2016 Elsevier Inc. ° °

## 2015-08-03 NOTE — MAU Note (Signed)
Patient presents with symptoms of UTI x 4 days, having pain with urination on and off, urinary urgency, vaginal discharge yellow with an odor.

## 2015-08-04 LAB — GC/CHLAMYDIA PROBE AMP (~~LOC~~) NOT AT ARMC
Chlamydia: NEGATIVE
Neisseria Gonorrhea: NEGATIVE

## 2015-08-04 LAB — RPR: RPR Ser Ql: NONREACTIVE

## 2015-08-05 LAB — HIV ANTIBODY (ROUTINE TESTING W REFLEX): HIV Screen 4th Generation wRfx: NONREACTIVE

## 2015-08-06 LAB — URINE CULTURE: Culture: >=100000 — AB

## 2015-12-30 ENCOUNTER — Emergency Department (HOSPITAL_COMMUNITY)
Admission: EM | Admit: 2015-12-30 | Discharge: 2015-12-30 | Disposition: A | Discharge status: Home / Self Care | Payer: No Typology Code available for payment source | Attending: Emergency Medicine | Admitting: Emergency Medicine

## 2015-12-30 ENCOUNTER — Encounter (HOSPITAL_COMMUNITY): Payer: Self-pay | Admitting: Emergency Medicine

## 2015-12-30 DIAGNOSIS — Y92410 Unspecified street and highway as the place of occurrence of the external cause: Secondary | ICD-10-CM | POA: Diagnosis not present

## 2015-12-30 DIAGNOSIS — S199XXA Unspecified injury of neck, initial encounter: Secondary | ICD-10-CM | POA: Insufficient documentation

## 2015-12-30 DIAGNOSIS — R51 Headache: Secondary | ICD-10-CM | POA: Insufficient documentation

## 2015-12-30 DIAGNOSIS — Y939 Activity, unspecified: Secondary | ICD-10-CM | POA: Insufficient documentation

## 2015-12-30 DIAGNOSIS — Y999 Unspecified external cause status: Secondary | ICD-10-CM | POA: Insufficient documentation

## 2015-12-30 MED ORDER — METHOCARBAMOL 500 MG PO TABS: 500.0000 mg | ORAL_TABLET | Freq: Two times a day (BID) | ORAL | 0 refills | Status: DC

## 2015-12-30 MED ORDER — IBUPROFEN 600 MG PO TABS: 600.0000 mg | ORAL_TABLET | Freq: Four times a day (QID) | ORAL | 0 refills | Status: DC | PRN
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
## 2015-12-30 NOTE — ED Notes (Signed)
Patient is A&Ox4 at this time.  Patient in no signs of distress.  Please see providers note for complete history and physical exam.  
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
## 2015-12-30 NOTE — ED Notes (Addendum)
Pt placed in rigid cervical collar, due to neck pain, per Joellen Jersey - RN.

## 2015-12-30 NOTE — Discharge Instructions (Signed)
Take your medications as prescribed. I also recommend applying ice and/or heat to affected area for 15-20 minutes 3-4 times daily for additional pain relief. Refrain from doing any heavy lifting, squatting or repetitive movements that exacerbate your symptoms. Follow-up with your primary care provider in the next week if her symptoms have not improved.  Please return to the Emergency Department if symptoms worsen or new onset of fever, headache, lightheadedness, dizziness, visual changes, neck stiffness, chest pain, difficulty breathing, abdominal pain, vomiting, numbness, tingling, weakness.

## 2015-12-30 NOTE — ED Notes (Signed)
Patient Alert and oriented X4. Stable and ambulatory. Patient verbalized understanding of the discharge instructions.  Patient belongings were taken by the patient.  

## 2018-03-08 ENCOUNTER — Emergency Department (HOSPITAL_COMMUNITY): Payer: Medicaid Other

## 2018-03-08 ENCOUNTER — Encounter (HOSPITAL_COMMUNITY): Payer: Self-pay

## 2018-03-08 ENCOUNTER — Emergency Department (HOSPITAL_COMMUNITY)
Admission: EM | Admit: 2018-03-08 | Discharge: 2018-03-08 | Disposition: A | Discharge status: Home / Self Care | Payer: Medicaid Other | Attending: Emergency Medicine | Admitting: Emergency Medicine

## 2018-03-08 DIAGNOSIS — M7918 Myalgia, other site: Secondary | ICD-10-CM | POA: Diagnosis not present

## 2018-03-08 DIAGNOSIS — N739 Female pelvic inflammatory disease, unspecified: Secondary | ICD-10-CM | POA: Diagnosis not present

## 2018-03-08 DIAGNOSIS — R6883 Chills (without fever): Secondary | ICD-10-CM | POA: Diagnosis not present

## 2018-03-08 DIAGNOSIS — N390 Urinary tract infection, site not specified: Secondary | ICD-10-CM

## 2018-03-08 DIAGNOSIS — R319 Hematuria, unspecified: Secondary | ICD-10-CM | POA: Insufficient documentation

## 2018-03-08 DIAGNOSIS — R102 Pelvic and perineal pain: Secondary | ICD-10-CM | POA: Diagnosis present

## 2018-03-08 DIAGNOSIS — N730 Acute parametritis and pelvic cellulitis: Secondary | ICD-10-CM

## 2018-03-08 LAB — WET PREP, GENITAL
Clue Cells Wet Prep HPF POC: NONE SEEN
Sperm: NONE SEEN
Trich, Wet Prep: NONE SEEN
Yeast Wet Prep HPF POC: NONE SEEN

## 2018-03-08 LAB — CBC WITH DIFFERENTIAL/PLATELET
Abs Immature Granulocytes: 0.02 10*3/uL (ref 0.00–0.07)
Basophils Absolute: 0 10*3/uL (ref 0.0–0.1)
Basophils Relative: 0 %
Eosinophils Absolute: 0.1 10*3/uL (ref 0.0–0.5)
Eosinophils Relative: 1 %
HCT: 41.4 % (ref 36.0–46.0)
Hemoglobin: 13.3 g/dL (ref 12.0–15.0)
Immature Granulocytes: 0 %
Lymphocytes Relative: 13 %
Lymphs Abs: 0.7 10*3/uL (ref 0.7–4.0)
MCH: 27.5 pg (ref 26.0–34.0)
MCHC: 32.1 g/dL (ref 30.0–36.0)
MCV: 85.5 fL (ref 80.0–100.0)
Monocytes Absolute: 0.8 10*3/uL (ref 0.1–1.0)
Monocytes Relative: 14 %
Neutro Abs: 3.9 10*3/uL (ref 1.7–7.7)
Neutrophils Relative %: 72 %
Platelets: 156 10*3/uL (ref 150–400)
RBC: 4.84 MIL/uL (ref 3.87–5.11)
RDW: 13.4 % (ref 11.5–15.5)
WBC: 5.4 10*3/uL (ref 4.0–10.5)
nRBC: 0 % (ref 0.0–0.2)

## 2018-03-08 LAB — COMPREHENSIVE METABOLIC PANEL
ALT: 17 U/L (ref 0–44)
AST: 21 U/L (ref 15–41)
Albumin: 4.2 g/dL (ref 3.5–5.0)
Alkaline Phosphatase: 37 U/L — ABNORMAL LOW (ref 38–126)
Anion gap: 11 (ref 5–15)
BUN: 6 mg/dL (ref 6–20)
CO2: 22 mmol/L (ref 22–32)
Calcium: 9.3 mg/dL (ref 8.9–10.3)
Chloride: 103 mmol/L (ref 98–111)
Creatinine, Ser: 1.04 mg/dL — ABNORMAL HIGH (ref 0.44–1.00)
GFR calc Af Amer: >60 mL/min (ref >60)
GFR calc non Af Amer: >60 mL/min (ref >60)
Glucose, Bld: 94 mg/dL (ref 70–99)
Potassium: 3.9 mmol/L (ref 3.5–5.1)
Sodium: 136 mmol/L (ref 135–145)
Total Bilirubin: 0.3 mg/dL (ref 0.3–1.2)
Total Protein: 7.2 g/dL (ref 6.5–8.1)

## 2018-03-08 LAB — LIPASE, BLOOD: Lipase: 29 U/L (ref 11–51)

## 2018-03-08 LAB — URINALYSIS, ROUTINE W REFLEX MICROSCOPIC
Bilirubin Urine: NEGATIVE
Glucose, UA: NEGATIVE mg/dL
Hgb urine dipstick: NEGATIVE
Ketones, ur: 5 mg/dL — AB
Nitrite: NEGATIVE
Protein, ur: NEGATIVE mg/dL
Specific Gravity, Urine: 1.027 (ref 1.005–1.030)
pH: 6 (ref 5.0–8.0)

## 2018-03-08 LAB — POC URINE PREG, ED: Preg Test, Ur: NEGATIVE

## 2018-03-08 MED ORDER — SODIUM CHLORIDE 0.9 % IV BOLUS
1000.0000 mL | Freq: Once | INTRAVENOUS | Status: AC
  Administered 2018-03-08: 1000 mL via INTRAVENOUS

## 2018-03-08 MED ORDER — OXYCODONE-ACETAMINOPHEN 5-325 MG PO TABS: 1.0000 | ORAL_TABLET | Freq: Four times a day (QID) | ORAL | 0 refills | Status: DC | PRN

## 2018-03-08 MED ORDER — DOXYCYCLINE HYCLATE 100 MG PO CAPS: 100.0000 mg | ORAL_CAPSULE | Freq: Two times a day (BID) | ORAL | 0 refills | Status: DC

## 2018-03-08 MED ORDER — CEFTRIAXONE SODIUM 250 MG IJ SOLR
250.0000 mg | Freq: Once | INTRAMUSCULAR | Status: AC
  Administered 2018-03-08: 250 mg via INTRAMUSCULAR
  Filled 2018-03-08: qty 250

## 2018-03-08 MED ORDER — MORPHINE SULFATE (PF) 4 MG/ML IV SOLN
4.0000 mg | Freq: Once | INTRAVENOUS | Status: AC
  Administered 2018-03-08: 4 mg via INTRAVENOUS
  Filled 2018-03-08: qty 1

## 2018-03-08 MED ORDER — ACETAMINOPHEN 325 MG PO TABS
650.0000 mg | ORAL_TABLET | Freq: Once | ORAL | Status: AC
Start: 2018-03-08 — End: 2018-03-08
  Administered 2018-03-08: 650 mg via ORAL
  Filled 2018-03-08: qty 2

## 2018-03-08 MED ORDER — CEPHALEXIN 500 MG PO CAPS: 500.0000 mg | ORAL_CAPSULE | Freq: Two times a day (BID) | ORAL | 0 refills | Status: DC

## 2018-03-08 MED ORDER — LIDOCAINE HCL (PF) 1 % IJ SOLN
INTRAMUSCULAR | Status: AC
  Administered 2018-03-08: 0.9 mL
  Filled 2018-03-08: qty 5

## 2018-03-08 MED ORDER — IOHEXOL 300 MG/ML  SOLN
100.0000 mL | Freq: Once | INTRAMUSCULAR | Status: AC | PRN
  Administered 2018-03-08: 100 mL via INTRAVENOUS

## 2018-03-08 NOTE — ED Provider Notes (Signed)
[facility] EMERGENCY DEPARTMENT Provider Note   CSN: [id number] Arrival date & time: 03/08/18  0744     History   Chief Complaint Chief Complaint  Patient presents with  . body aches/chills    HPI Sue Young is a 28 y.o. female.  HPI   28 year old female presents today with complaints of pelvic pain.  Patient notes symptoms started yesterday with body aches and chills.  She notes in the early morning hours she developed pain in her pelvic region with heaviness.  She describes this pain is cramping in nature.  She notes associated vaginal discharge.  She denies any urinary symptoms upper abdominal pain nausea vomiting.  She notes she last had intercourse 4 days ago, previous to that was in January, both of these episodes were unprotected.  She notes her last bowel movement was yesterday and normal.  She also reports urinary frequency and urgency with no dysuria or change in color and odor.  She notes approximate 2 weeks ago she was treated for urinary tract infection.   Past Medical History:  Diagnosis Date  . Anemia   . BV (bacterial vaginosis)   . Chlamydia   . Gonorrhea   . No pertinent past medical history   . Recurrent upper respiratory infection (URI)   . Thrombocytopenia [facility])     Patient Active Problem List   Diagnosis Date Noted  . Post-dates pregnancy, delivered, current hospitalization 05/24/2014  . Labor and delivery indication for care or intervention 05/23/2014  . Thrombocytopenia ([place]) 05/16/2014    Past Surgical History:  Procedure Laterality Date  . NO PAST SURGERIES       OB History    Gravida  2   Para  2   Term  2   Preterm      AB      Living  2     SAB      TAB      Ectopic      Multiple  0   Live Births  2            Home Medications    Prior to Admission medications   Medication Sig Start Date End Date Taking? Authorizing Provider  cephALEXin (KEFLEX) 500 MG capsule Take 1 capsule (500 mg  total) by mouth 2 (two) times daily. 03/08/18   Somnang Mahan, Dellis Filbert, PA-C  doxycycline (VIBRAMYCIN) 100 MG capsule Take 1 capsule (100 mg total) by mouth 2 (two) times daily. 03/08/18   Izak Anding, Dellis Filbert, PA-C  ibuprofen (ADVIL,MOTRIN) 600 MG tablet Take 1 tablet (600 mg total) by mouth every 6 (six) hours as needed. 12/30/15   Nona Dell, PA-C  levonorgestrel (MIRENA) 20 MCG/24HR IUD 1 each by Intrauterine route once. Reported on 08/03/2015    [provider]  methocarbamol (ROBAXIN) 500 MG tablet Take 1 tablet (500 mg total) by mouth 2 (two) times daily. 12/30/15   Nona Dell, PA-C  metroNIDAZOLE (FLAGYL) 500 MG tablet Take 1 tablet (500 mg total) by mouth 2 (two) times daily. 08/03/15   Rasch, Anderson Malta I, NP  oxyCODONE-acetaminophen (PERCOCET/ROXICET) 5-325 MG tablet Take 1 tablet by mouth every 6 (six) hours as needed for severe pain. 03/08/18   Okey Regal, PA-C    Family History Family History  Problem Relation Age of Onset  . Diabetes Mother     Social History Social History   Tobacco Use  . Smoking status: Never Smoker  . Smokeless tobacco: Never Used  Substance Use Topics  .  Alcohol use: No  . Drug use: No     Allergies   Patient has no known allergies.   Review of Systems Review of Systems  All other systems reviewed and are negative.    Physical Exam Updated Vital Signs BP 133/75   Pulse 92   Temp 98.1 F (36.7 C) (Oral)   Resp 16   Ht 5\' 6"  (1.676 m)   Wt 63.5 kg   LMP 02/28/2018   SpO2 99%   BMI 22.60 kg/m   Physical Exam Vitals signs and nursing note reviewed.  Constitutional:      Appearance: She is well-developed.  HENT:     Head: Normocephalic and atraumatic.  Eyes:     General: No scleral icterus.       Right eye: No discharge.        Left eye: No discharge.     Conjunctiva/sclera: Conjunctivae normal.     Pupils: Pupils are equal, round, and reactive to light.  Neck:     Musculoskeletal: Normal range of  motion.     Vascular: No JVD.     Trachea: No tracheal deviation.  Pulmonary:     Effort: Pulmonary effort is normal.     Breath sounds: No stridor.  Abdominal:     Comments: Tenderness palpation of bilateral lower abdomen remainder of abdomen soft nontender  Genitourinary:    Comments: Purulent discharge noted in vaginal vault, exam is uncomfortable but no cervical motion tenderness, no adnexal tenderness or masses Neurological:     Mental Status: She is alert and oriented to person, place, and time.     Coordination: Coordination normal.  Psychiatric:        Behavior: Behavior normal.        Thought Content: Thought content normal.        Judgment: Judgment normal.      ED Treatments / Results  Labs (all labs ordered are listed, but only abnormal results are displayed) Labs Reviewed  WET PREP, GENITAL - Abnormal; Notable for the following components:      Result Value   WBC, Wet Prep HPF POC MANY (*)    All other components within normal limits  COMPREHENSIVE METABOLIC PANEL - Abnormal; Notable for the following components:   Creatinine, Ser 1.04 (*)    Alkaline Phosphatase 37 (*)    All other components within normal limits  URINALYSIS, ROUTINE W REFLEX MICROSCOPIC - Abnormal; Notable for the following components:   APPearance CLOUDY (*)    Ketones, ur 5 (*)    Leukocytes,Ua LARGE (*)    Bacteria, UA RARE (*)    All other components within normal limits  CBC WITH DIFFERENTIAL/PLATELET  LIPASE, BLOOD  POC URINE PREG, ED  GC/CHLAMYDIA PROBE AMP ([place]) NOT AT [facility]    EKG None  Radiology US Transvaginal Non-ob  Result Date: 03/08/2018 CLINICAL DATA:  Pelvic pain for 1 day EXAM: TRANSABDOMINAL AND TRANSVAGINAL ULTRASOUND OF PELVIS DOPPLER ULTRASOUND OF OVARIES TECHNIQUE: Both transabdominal and transvaginal ultrasound examinations of the pelvis were performed. Transabdominal technique was performed for global imaging of the pelvis including uterus, ovaries,  adnexal regions, and pelvic cul-de-sac. It was necessary to proceed with endovaginal exam following the transabdominal exam to visualize the LEFT ovary. Color and duplex Doppler ultrasound was utilized to evaluate blood flow to the ovaries. COMPARISON:  CT abdomen and pelvis 03/08/2018 FINDINGS: Uterus Measurements: 5.9 x 5.0 x 5.6 cm = volume: 87 mL. Retroverted. Otherwise normal morphology without mass Endometrium Thickness:  3 mm.  No endometrial fluid or focal abnormality Right ovary Measurements: 3.4 x 2.4 x 2.6 cm = volume: 11.28 mL. Simple cyst RIGHT ovary 3.1 cm diameter. Otherwise normal appearance. Internal blood flow present on color Doppler imaging. Left ovary Measurements: 1.6 x 3.8 x 1.4 cm = volume: 4.5 mL. Normal morphology without mass. Internal blood flow present on color Doppler imaging. Pulsed Doppler evaluation of both ovaries demonstrates normal low-resistance arterial and venous waveforms. Other findings Small amount of nonspecific free pelvic fluid. No additional adnexal masses identified. IMPRESSION: 3.1 cm diameter simple RIGHT ovarian cyst. Otherwise normal exam. Electronically Signed   By: Lavonia Dana M.D.   On: 03/08/2018 14:24   US Pelvis Complete  Result Date: 03/08/2018 CLINICAL DATA:  Pelvic pain for 1 day EXAM: TRANSABDOMINAL AND TRANSVAGINAL ULTRASOUND OF PELVIS DOPPLER ULTRASOUND OF OVARIES TECHNIQUE: Both transabdominal and transvaginal ultrasound examinations of the pelvis were performed. Transabdominal technique was performed for global imaging of the pelvis including uterus, ovaries, adnexal regions, and pelvic cul-de-sac. It was necessary to proceed with endovaginal exam following the transabdominal exam to visualize the LEFT ovary. Color and duplex Doppler ultrasound was utilized to evaluate blood flow to the ovaries. COMPARISON:  CT abdomen and pelvis 03/08/2018 FINDINGS: Uterus Measurements: 5.9 x 5.0 x 5.6 cm = volume: 87 mL. Retroverted. Otherwise normal morphology  without mass Endometrium Thickness: 3 mm.  No endometrial fluid or focal abnormality Right ovary Measurements: 3.4 x 2.4 x 2.6 cm = volume: 11.28 mL. Simple cyst RIGHT ovary 3.1 cm diameter. Otherwise normal appearance. Internal blood flow present on color Doppler imaging. Left ovary Measurements: 1.6 x 3.8 x 1.4 cm = volume: 4.5 mL. Normal morphology without mass. Internal blood flow present on color Doppler imaging. Pulsed Doppler evaluation of both ovaries demonstrates normal low-resistance arterial and venous waveforms. Other findings Small amount of nonspecific free pelvic fluid. No additional adnexal masses identified. IMPRESSION: 3.1 cm diameter simple RIGHT ovarian cyst. Otherwise normal exam. Electronically Signed   By: Lavonia Dana M.D.   On: 03/08/2018 14:24   Ct Abdomen Pelvis W Contrast  Result Date: 03/08/2018 CLINICAL DATA:  Abdominal pain, pelvic pain, body aches EXAM: CT ABDOMEN AND PELVIS WITH CONTRAST TECHNIQUE: Multidetector CT imaging of the abdomen and pelvis was performed using the standard protocol following bolus administration of intravenous contrast. CONTRAST:  138mL OMNIPAQUE IOHEXOL 300 MG/ML  SOLN COMPARISON:  None. FINDINGS: Lower chest: No acute abnormality. Hepatobiliary: No focal liver abnormality is seen. No gallstones, gallbladder wall thickening, or biliary dilatation. Pancreas: Unremarkable. No pancreatic ductal dilatation or surrounding inflammatory changes. Spleen: Normal in size without focal abnormality. Adrenals/Urinary Tract: Adrenal glands are unremarkable. Kidneys are normal, without renal calculi, focal lesion, or hydronephrosis. Bladder is unremarkable. Stomach/Bowel: Stomach is within normal limits. Appendix appears normal. No evidence of bowel wall thickening, distention, or inflammatory changes. Vascular/Lymphatic: No significant vascular findings are present. 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 No enlarged abdominal or pelvic lymph nodes.  Reproductive: No mass or other abnormality. Other: No abdominal wall hernia or abnormality. Small volume nonspecific free fluid in the low pelvis. Musculoskeletal: No acute or significant osseous findings. IMPRESSION: 1. Small volume nonspecific free fluid in the low pelvis, likely functional in the reproductive age setting. There are no definite acute CT findings of the abdomen or pelvis to explain abdominal or pelvic pain or body aches. 2.  Normal appendix. Electronically Signed   By: Eddie Candle M.D.   On: 03/08/2018 11:30   [place]  Art/ven Flow Abd Pelv Doppler  Result Date: 03/08/2018 CLINICAL DATA:  Pelvic pain for 1 day EXAM: TRANSABDOMINAL AND TRANSVAGINAL ULTRASOUND OF PELVIS DOPPLER ULTRASOUND OF OVARIES TECHNIQUE: Both transabdominal and transvaginal ultrasound examinations of the pelvis were performed. Transabdominal technique was performed for global imaging of the pelvis including uterus, ovaries, adnexal regions, and pelvic cul-de-sac. It was necessary to proceed with endovaginal exam following the transabdominal exam to visualize the LEFT ovary. Color and duplex Doppler ultrasound was utilized to evaluate blood flow to the ovaries. COMPARISON:  CT abdomen and pelvis 03/08/2018 FINDINGS: Uterus Measurements: 5.9 x 5.0 x 5.6 cm = volume: 87 mL. Retroverted. Otherwise normal morphology without mass Endometrium Thickness: 3 mm.  No endometrial fluid or focal abnormality Right ovary Measurements: 3.4 x 2.4 x 2.6 cm = volume: 11.28 mL. Simple cyst RIGHT ovary 3.1 cm diameter. Otherwise normal appearance. Internal blood flow present on color Doppler imaging. Left ovary Measurements: 1.6 x 3.8 x 1.4 cm = volume: 4.5 mL. Normal morphology without mass. Internal blood flow present on color Doppler imaging. Pulsed Doppler evaluation of both ovaries demonstrates normal low-resistance arterial and venous waveforms. Other findings Small amount of nonspecific free pelvic fluid. No additional adnexal masses  identified. IMPRESSION: 3.1 cm diameter simple RIGHT ovarian cyst. Otherwise normal exam. Electronically Signed   By: Lavonia Dana M.D.   On: 03/08/2018 14:24    Procedures Procedures (including critical care time)  Medications Ordered in ED Medications  morphine 4 MG/ML injection 4 mg (4 mg Intravenous Given 03/08/18 0910)  iohexol (OMNIPAQUE) 300 MG/ML solution 100 mL (100 mLs Intravenous Contrast Given 03/08/18 1056)  morphine 4 MG/ML injection 4 mg (4 mg Intravenous Given 03/08/18 1200)  sodium chloride 0.9 % bolus 1,000 mL (0 mLs Intravenous Stopped 03/08/18 1304)  cefTRIAXone (ROCEPHIN) injection 250 mg (250 mg Intramuscular Given 03/08/18 1200)  lidocaine (PF) (XYLOCAINE) 1 % injection (0.9 mLs  Given 03/08/18 1200)  acetaminophen (TYLENOL) tablet 650 mg (650 mg Oral Given 03/08/18 1305)     Initial Impression / Assessment and Plan / ED Course  I have reviewed the triage vital signs and the nursing notes.  Pertinent labs & imaging results that were available during my care of the patient were reviewed by me and considered in my medical decision making (see chart for details).     Labs: Wet prep, CBC, CMP lipase, point-of-care urine prior, urinalysis  Imaging: Ultrasound pelvis complete, CT abdomen pelvis  Consults:  Therapeutics: Ceftriaxone, normal saline, morphine  Discharge Meds: Doxycycline, Keflex, Percocet  Assessment/Plan:   28 year old female presents today with likely pelvic inflammatory disease.  Patient has purulent discharge on exam complaining of pelvic pain.  Her exam is reassuring and that the CT and ultrasound showed no significant abnormalities including abscess.  Patient is afebrile with no white count, she was slightly tachycardic and given fluids here.  I do feel patient is stable for outpatient management.  She was given ceftriaxone here, she will be discharged home with doxycycline and Keflex for presumed PID and urinary tract infection.  Patient will  follow-up with her OB/GYN in 3 to 5 days, she will return to the emergency room in 48 hours if symptoms have not improved and return immediately if they worsen.  She verbalized understanding and agreement to today's plan had no further questions or concerns.  Final Clinical Impressions(s) / ED Diagnoses   Final diagnoses:  PID (acute pelvic inflammatory disease)  Urinary tract infection with hematuria, site unspecified    ED  Discharge Orders         Ordered    doxycycline (VIBRAMYCIN) 100 MG capsule  2 times daily     03/08/18 1454    oxyCODONE-acetaminophen (PERCOCET/ROXICET) 5-325 MG tablet  Every 6 hours PRN     03/08/18 1454    cephALEXin (KEFLEX) 500 MG capsule  2 times daily     03/08/18 1502           Okey Regal, PA-C 03/08/18 1504    Dorie Rank, MD 03/10/18 [phone]

## 2018-03-08 NOTE — ED Triage Notes (Signed)
Patient reports that she developed body aches yesterday with headache and chills. States that the pain is now worse in pelvic area and legs, has not taken any otc meds. Alert and oriented

## 2018-03-08 NOTE — ED Notes (Signed)
Patient verbalizes understanding of discharge instructions. Opportunity for questioning and answers were provided. Armband removed by staff, pt discharged from ED. Pt amb to lobby. Prescriptions and follow up care reviewed.

## 2018-03-08 NOTE — ED Notes (Signed)
Patient transported to CT 

## 2018-03-08 NOTE — Discharge Instructions (Addendum)
Please read attached information. If you experience any new or worsening signs or symptoms please return to the emergency room for evaluation. Please follow-up with your primary care provider or specialist as discussed. Please use medication prescribed only as directed and discontinue taking if you have any concerning signs or symptoms.   °

## 2018-03-09 LAB — GC/CHLAMYDIA PROBE AMP (~~LOC~~) NOT AT ARMC
Chlamydia: NEGATIVE
Neisseria Gonorrhea: NEGATIVE

## 2018-03-12 ENCOUNTER — Emergency Department (HOSPITAL_COMMUNITY)
Admission: EM | Admit: 2018-03-12 | Discharge: 2018-03-12 | Disposition: A | Discharge status: Home / Self Care | Payer: Medicaid Other | Attending: Emergency Medicine | Admitting: Emergency Medicine

## 2018-03-12 ENCOUNTER — Encounter (HOSPITAL_COMMUNITY): Payer: Self-pay | Admitting: Emergency Medicine

## 2018-03-12 DIAGNOSIS — R350 Frequency of micturition: Secondary | ICD-10-CM | POA: Insufficient documentation

## 2018-03-12 DIAGNOSIS — R112 Nausea with vomiting, unspecified: Secondary | ICD-10-CM | POA: Diagnosis not present

## 2018-03-12 DIAGNOSIS — R1030 Lower abdominal pain, unspecified: Secondary | ICD-10-CM | POA: Diagnosis not present

## 2018-03-12 DIAGNOSIS — R300 Dysuria: Secondary | ICD-10-CM | POA: Insufficient documentation

## 2018-03-12 LAB — COMPREHENSIVE METABOLIC PANEL
ALT: 27 U/L (ref 0–44)
AST: 31 U/L (ref 15–41)
Albumin: 3.9 g/dL (ref 3.5–5.0)
Alkaline Phosphatase: 33 U/L — ABNORMAL LOW (ref 38–126)
Anion gap: 10 (ref 5–15)
BUN: 10 mg/dL (ref 6–20)
CO2: 24 mmol/L (ref 22–32)
Calcium: 9 mg/dL (ref 8.9–10.3)
Chloride: 104 mmol/L (ref 98–111)
Creatinine, Ser: 0.94 mg/dL (ref 0.44–1.00)
GFR calc Af Amer: >60 mL/min (ref >60)
Glucose, Bld: 90 mg/dL (ref 70–99)
Potassium: 3.6 mmol/L (ref 3.5–5.1)
Sodium: 138 mmol/L (ref 135–145)
Total Bilirubin: 0.3 mg/dL (ref 0.3–1.2)
Total Protein: 7.2 g/dL (ref 6.5–8.1)

## 2018-03-12 LAB — URINALYSIS, ROUTINE W REFLEX MICROSCOPIC
Bacteria, UA: NONE SEEN
Bilirubin Urine: NEGATIVE
Glucose, UA: NEGATIVE mg/dL
Ketones, ur: NEGATIVE mg/dL
Nitrite: NEGATIVE
Protein, ur: NEGATIVE mg/dL
Specific Gravity, Urine: 1.029 (ref 1.005–1.030)
pH: 5 (ref 5.0–8.0)

## 2018-03-12 LAB — CBC WITH DIFFERENTIAL/PLATELET
ABS IMMATURE GRANULOCYTES: 0.01 10*3/uL (ref 0.00–0.07)
Basophils Absolute: 0 10*3/uL (ref 0.0–0.1)
Basophils Relative: 0 %
Eosinophils Absolute: 0.1 10*3/uL (ref 0.0–0.5)
Eosinophils Relative: 4 %
HCT: 43.1 % (ref 36.0–46.0)
HEMOGLOBIN: 13.7 g/dL (ref 12.0–15.0)
Immature Granulocytes: 0 %
LYMPHS PCT: 34 %
Lymphs Abs: 1.2 10*3/uL (ref 0.7–4.0)
MCH: 26.7 pg (ref 26.0–34.0)
MCHC: 31.8 g/dL (ref 30.0–36.0)
MCV: 83.9 fL (ref 80.0–100.0)
Monocytes Absolute: 0.4 10*3/uL (ref 0.1–1.0)
Monocytes Relative: 12 %
Neutro Abs: 1.7 10*3/uL (ref 1.7–7.7)
Neutrophils Relative %: 50 %
Platelets: 60 10*3/uL — ABNORMAL LOW (ref 150–400)
RBC: 5.14 MIL/uL — ABNORMAL HIGH (ref 3.87–5.11)
RDW: 13 % (ref 11.5–15.5)
WBC: 3.4 10*3/uL — ABNORMAL LOW (ref 4.0–10.5)
nRBC: 0 % (ref 0.0–0.2)

## 2018-03-12 LAB — LIPASE, BLOOD: Lipase: 33 U/L (ref 11–51)

## 2018-03-12 MED ORDER — ONDANSETRON HCL 4 MG/2ML IJ SOLN
4.0000 mg | Freq: Once | INTRAMUSCULAR | Status: AC
  Administered 2018-03-12: 4 mg via INTRAVENOUS
  Filled 2018-03-12: qty 2

## 2018-03-12 MED ORDER — ONDANSETRON 8 MG PO TBDP: 8.0000 mg | ORAL_TABLET | Freq: Three times a day (TID) | ORAL | 0 refills | Status: DC | PRN

## 2018-03-12 MED ORDER — KETOROLAC TROMETHAMINE 30 MG/ML IJ SOLN
15.0000 mg | Freq: Once | INTRAMUSCULAR | Status: AC
  Administered 2018-03-12: 15 mg via INTRAVENOUS
  Filled 2018-03-12: qty 1

## 2018-03-12 MED ORDER — SODIUM CHLORIDE 0.9 % IV BOLUS
1000.0000 mL | Freq: Once | INTRAVENOUS | Status: AC
  Administered 2018-03-12: 1000 mL via INTRAVENOUS

## 2018-03-12 NOTE — Discharge Instructions (Signed)
Nausea and Vomiting  Hand washing: Wash your hands throughout the day, but especially before and after touching the face, using the restroom, sneezing, coughing, or touching surfaces that have been coughed or sneezed upon. Hydration: Symptoms will be intensified and complicated by dehydration. Dehydration can also extend the duration of symptoms. Drink plenty of fluids and get plenty of rest. You should be drinking at least half a liter of water an hour to stay hydrated. Electrolyte drinks (ex. Gatorade, Powerade, Pedialyte) are also encouraged. You should be drinking enough fluids to make your urine light yellow, almost clear. If this is not the case, you are not drinking enough water. Please note that some of the treatments indicated below will not be effective if you are not adequately hydrated. Diet: Please concentrate on hydration, however, you may introduce food slowly.  Start with a clear liquid diet, progressed to a full liquid diet, and then bland solids as you are able. Pain or fever: Ibuprofen, Naproxen, or Tylenol for pain or fever.  Nausea/vomiting: Use the Zofran for nausea or vomiting. Follow-up: Follow-up with OB/GYN, as planned. Return: Return should you develop a fever, bloody diarrhea, increased abdominal pain, uncontrolled vomiting, or any other major concerns.  For prescription assistance, may try using prescription discount sites or apps, such as goodrx.com

## 2018-03-12 NOTE — ED Triage Notes (Addendum)
Pt reports she was recently diagnosed with PID and UTI, is currently on abx, began vomiting Friday. States she is unable to keep meds down. Continues to have dysuria and lower abd pain. Denies vaginal bleeding or hematuria.

## 2018-03-12 NOTE — ED Provider Notes (Signed)
[facility] EMERGENCY DEPARTMENT Provider Note   CSN: [id number] Arrival date & time: 03/12/18  0749     History   Chief Complaint Chief Complaint  Patient presents with  . Emesis    HPI Sue Young is a 28 y.o. female.  HPI   Sue Young is a 28 y.o. female, with a history of anemia, BV, distant chlamydia and gonorrhea, presenting to the ED with nausea and vomiting beginning two days ago.  She has had multiple episodes of nonbilious, nonbloody emesis.  Patient originally had onset of lower abdominal pain February 12 while she was out shopping.  Also complained of dysuria, decreased urine output, and urinary frequency.  She was evaluated in the ED the same day, diagnosed with PID and possible UTI, and prescribed doxycycline and Keflex.   Her vomiting would occur 30 minutes to an hour after taking the antibiotic, she would vomit 2-3 times, and then feel better.  She states she did not see any pill or capsule fragments in the vomit until her vomiting this morning.   She states her abdominal pain has improved, describes it as dull, sometimes sharp, 5/10, suprapubic, radiating across the lower abdomen.  She still has some urinary frequency, decreased urine output, and dysuria. She is sexually active with multiple female partners.  She denies alcohol use with the antibiotic. She has an appointment with Sanjuana Kava at [facility] scheduled for February 18. Denies fever, diarrhea, hematochezia/melena, abnormal vaginal discharge/bleeding, chest pain, shortness of breath, hematuria, hives, itching, swelling, or any other complaints.   Past Medical History:  Diagnosis Date  . Anemia   . BV (bacterial vaginosis)   . Chlamydia   . Gonorrhea   . No pertinent past medical history   . Recurrent upper respiratory infection (URI)   . Thrombocytopenia [facility])     Patient Active Problem List   Diagnosis Date Noted  . Post-dates pregnancy, delivered, current  hospitalization 05/24/2014  . Labor and delivery indication for care or intervention 05/23/2014  . Thrombocytopenia ([place]) 05/16/2014    Past Surgical History:  Procedure Laterality Date  . NO PAST SURGERIES       OB History    Gravida  2   Para  2   Term  2   Preterm      AB      Living  2     SAB      TAB      Ectopic      Multiple  0   Live Births  2            Home Medications    Prior to Admission medications   Medication Sig Start Date End Date Taking? Authorizing Provider  cephALEXin (KEFLEX) 500 MG capsule Take 1 capsule (500 mg total) by mouth 2 (two) times daily. 03/08/18   Hedges, Dellis Filbert, PA-C  doxycycline (VIBRAMYCIN) 100 MG capsule Take 1 capsule (100 mg total) by mouth 2 (two) times daily. 03/08/18   Hedges, Dellis Filbert, PA-C  ibuprofen (ADVIL,MOTRIN) 600 MG tablet Take 1 tablet (600 mg total) by mouth every 6 (six) hours as needed. 12/30/15   Nona Dell, PA-C  levonorgestrel (MIRENA) 20 MCG/24HR IUD 1 each by Intrauterine route once. Reported on 08/03/2015    [provider]  methocarbamol (ROBAXIN) 500 MG tablet Take 1 tablet (500 mg total) by mouth 2 (two) times daily. 12/30/15   Nona Dell, PA-C  metroNIDAZOLE (FLAGYL) 500 MG tablet Take 1 tablet (  500 mg total) by mouth 2 (two) times daily. 08/03/15   Rasch, Anderson Malta I, NP  ondansetron (ZOFRAN ODT) 8 MG disintegrating tablet Take 1 tablet (8 mg total) by mouth every 8 (eight) hours as needed for nausea or vomiting. 03/12/18   Joy, Shawn C, PA-C  oxyCODONE-acetaminophen (PERCOCET/ROXICET) 5-325 MG tablet Take 1 tablet by mouth every 6 (six) hours as needed for severe pain. 03/08/18   Okey Regal, PA-C    Family History Family History  Problem Relation Age of Onset  . Diabetes Mother     Social History Social History   Tobacco Use  . Smoking status: Never Smoker  . Smokeless tobacco: Never Used  Substance Use Topics  . Alcohol use: No  . Drug use: No      Allergies   Patient has no known allergies.   Review of Systems Review of Systems  Constitutional: Positive for fatigue. Negative for diaphoresis and fever.  Respiratory: Negative for shortness of breath.   Cardiovascular: Negative for chest pain.  Gastrointestinal: Positive for abdominal pain, nausea and vomiting. Negative for blood in stool, constipation and diarrhea.  Genitourinary: Positive for decreased urine volume, dysuria, frequency and pelvic pain. Negative for flank pain, hematuria, vaginal bleeding and vaginal discharge.  All other systems reviewed and are negative.    Physical Exam Updated Vital Signs BP (!) 137/98   Pulse 88   Temp 98.5 F (36.9 C) (Oral)   Resp 18   LMP 02/28/2018   SpO2 100%   Physical Exam Vitals signs and nursing note reviewed.  Constitutional:      General: She is not in acute distress.    Appearance: She is well-developed. She is not diaphoretic.  HENT:     Head: Normocephalic and atraumatic.     Mouth/Throat:     Mouth: Mucous membranes are moist.     Pharynx: Oropharynx is clear.  Eyes:     Conjunctiva/sclera: Conjunctivae normal.  Neck:     Musculoskeletal: Neck supple.  Cardiovascular:     Rate and Rhythm: Normal rate and regular rhythm.     Pulses: Normal pulses.     Heart sounds: Normal heart sounds.  Pulmonary:     Effort: Pulmonary effort is normal. No respiratory distress.     Breath sounds: Normal breath sounds.  Abdominal:     Palpations: Abdomen is soft.     Tenderness: There is abdominal tenderness. There is no guarding.    Musculoskeletal:     Right lower leg: No edema.     Left lower leg: No edema.  Lymphadenopathy:     Cervical: No cervical adenopathy.  Skin:    General: Skin is warm and dry.  Neurological:     Mental Status: She is alert.  Psychiatric:        Mood and Affect: Mood and affect normal.        Speech: Speech normal.        Behavior: Behavior normal.      ED Treatments /  Results  Labs (all labs ordered are listed, but only abnormal results are displayed) Labs Reviewed  COMPREHENSIVE METABOLIC PANEL - Abnormal; Notable for the following components:      Result Value   Alkaline Phosphatase 33 (*)    All other components within normal limits  CBC WITH DIFFERENTIAL/PLATELET - Abnormal; Notable for the following components:   WBC 3.4 (*)    RBC 5.14 (*)    Platelets 60 (*)    All other components  within normal limits  URINALYSIS, ROUTINE W REFLEX MICROSCOPIC - Abnormal; Notable for the following components:   APPearance HAZY (*)    Hgb urine dipstick SMALL (*)    Leukocytes,Ua SMALL (*)    All other components within normal limits  URINE CULTURE  LIPASE, BLOOD    EKG None  Radiology No results found.  Procedures Procedures (including critical care time)  Medications Ordered in ED Medications  sodium chloride 0.9 % bolus 1,000 mL (0 mLs Intravenous Stopped 03/12/18 1021)  ondansetron (ZOFRAN) injection 4 mg (4 mg Intravenous Given 03/12/18 0839)  ketorolac (TORADOL) 30 MG/ML injection 15 mg (15 mg Intravenous Given 03/12/18 0840)     Initial Impression / Assessment and Plan / ED Course  I have reviewed the triage vital signs and the nursing notes.  Pertinent labs & imaging results that were available during my care of the patient were reviewed by me and considered in my medical decision making (see chart for details).  Clinical Course as of Mar 12 1053  Sun Mar 12, 2018  1011 Patient states she feels much better.  Able to tolerate oral fluids.   [SJ]    Clinical Course User Index [SJ] Joy, Shawn C, PA-C    Patient presents with nausea and vomiting. Patient is nontoxic appearing, afebrile, not tachycardic, not tachypneic, not hypotensive, excellent SPO2 on room air, and is in no apparent distress. Pregnancy test negative 4 days ago.  GC/chlamydia negative.  Wet prep 4 days ago negative for BV or Trichomonas. I suspect the patient's nausea  and vomiting may be due to the antibiotics and in the absence of other associated symptoms, I would categorize this as a mild adverse effect and continued administration of the antibiotic would be based on her ability to control her nausea and vomiting. Progressive pathology, such as tubo-ovarian abscess, was considered, but thought less likely due to patient's improving pain.  Regardless, I discussed the option of repeat pelvic exam and repeat ultrasound, but patient declined. Overall improvement in patient's lab work today when compared to her visit on February 12. She has appropriate follow-up scheduled with OB/GYN 2 days from now.  We will add in Zofran for her nausea and vomiting.  She will continue the antibiotics, as previously prescribed.  The patient was given instructions for home care as well as return precautions. Patient voices understanding of these instructions, accepts the plan, and is comfortable with discharge.  Findings and plan of care discussed with Antony Blackbird, MD.    Vitals:   03/12/18 0755 03/12/18 1025  BP: (!) 137/98 105/60  Pulse: 88 65  Resp: 18 18  Temp: 98.5 F (36.9 C) 98.4 F (36.9 C)  TempSrc: Oral Oral  SpO2: 100% 99%    Final Clinical Impressions(s) / ED Diagnoses   Final diagnoses:  Non-intractable vomiting with nausea, unspecified vomiting type    ED Discharge Orders         Ordered    ondansetron (ZOFRAN ODT) 8 MG disintegrating tablet  Every 8 hours PRN     03/12/18 Boynton, Shawn C, PA-C 03/12/18 1055    Tegeler, Gwenyth Allegra, MD 03/12/18 1655

## 2018-03-12 NOTE — ED Notes (Signed)
Patient given ginger ale for po fluid challenge

## 2018-03-12 NOTE — ED Notes (Signed)
ED Provider at bedside. 

## 2018-03-13 LAB — URINE CULTURE: Culture: NO GROWTH

## 2020-04-09 ENCOUNTER — Ambulatory Visit: Payer: Medicaid Other | Admitting: Allergy

## 2020-05-20 ENCOUNTER — Other Ambulatory Visit: Payer: Self-pay | Admitting: Obstetrics & Gynecology

## 2020-06-16 ENCOUNTER — Ambulatory Visit (INDEPENDENT_AMBULATORY_CARE_PROVIDER_SITE_OTHER): Payer: Medicaid Other | Admitting: Allergy

## 2020-06-16 ENCOUNTER — Other Ambulatory Visit: Payer: Self-pay

## 2020-06-16 ENCOUNTER — Encounter: Payer: Self-pay | Admitting: Allergy

## 2020-06-16 VITALS — BP 110/72 | HR 75 | Temp 98.5°F | Resp 18 | Ht 68.4 in | Wt 161.6 lb

## 2020-06-16 DIAGNOSIS — H1013 Acute atopic conjunctivitis, bilateral: Secondary | ICD-10-CM | POA: Diagnosis not present

## 2020-06-16 DIAGNOSIS — L2089 Other atopic dermatitis: Secondary | ICD-10-CM

## 2020-06-16 DIAGNOSIS — J3089 Other allergic rhinitis: Secondary | ICD-10-CM

## 2020-06-16 DIAGNOSIS — L700 Acne vulgaris: Secondary | ICD-10-CM

## 2020-06-16 DIAGNOSIS — T781XXD Other adverse food reactions, not elsewhere classified, subsequent encounter: Secondary | ICD-10-CM

## 2020-06-16 MED ORDER — EPINEPHRINE 0.3 MG/0.3ML IJ SOAJ: 0.3000 mg | INTRAMUSCULAR | 2 refills | Status: DC | PRN

## 2020-06-16 MED ORDER — OLOPATADINE HCL 0.2 % OP SOLN: OPHTHALMIC | 5 refills | Status: DC

## 2020-06-16 MED ORDER — FLUTICASONE PROPIONATE 50 MCG/ACT NA SUSP: NASAL | 5 refills | Status: DC

## 2020-06-16 NOTE — Progress Notes (Signed)
New Patient Note  RE: Sue Young MRN: [id number] DOB: Jan 13, 1991 Date of Office Visit: 06/16/2020  Referring provider: Gerald Leitz Primary care provider: Andria Frames, PA-C   Chief Complaint: Wants food allergy testing  History of present illness: Sue Young is a 30 y.o. female presenting today for consultation for food allergy evaluation.  She would like to see if she has any food allergy.  She states she does have acne flare ups/bumps and feels food may be affecting this.  She has been concerned with food triggers for past year or more.    She sees dermatology and will be starting accutane soon.  She does feel maybe cheese causes acne flares.  She states she was recommended to keep a food journal but states has not been able to do so yet.     She will take claritin as needed for nasal congestion/drainage, itchy/watery eyes. These symptoms are seasonal during spring and fall.  The claritin does help.    She also has history of eczema managed by dermatology.  She does moisturize with vaseline or aquafor.    No history of asthma.   Review of systems: Review of Systems  Constitutional: Negative.   HENT:       See HPI  Eyes:       See HPI  Respiratory: Negative.   Cardiovascular: Negative.   Gastrointestinal: Negative.   Musculoskeletal: Negative.   Skin:       acne  Neurological: Negative.     All other systems negative unless noted above in HPI  Past medical history: Past Medical History:  Diagnosis Date  . Anemia   . Angio-edema   . BV (bacterial vaginosis)   . Chlamydia   . Eczema   . Gonorrhea   . No pertinent past medical history   . Recurrent upper respiratory infection (URI)   . Thrombocytopenia ([place])     Past surgical history: Past Surgical History:  Procedure Laterality Date  . NO PAST SURGERIES      Family history:  Family History  Problem Relation Age of Onset  . Diabetes Mother   . Asthma Brother   . Eczema  Brother   . Allergic rhinitis Neg Hx   . Urticaria Neg Hx     Social history: Lives in a condo with carpeting with gas heating and central, window, fan cooling.  Cats in the home.  No concern for roaches in the home.  There is concern for water damage, mildew in the home.  She is a Field seismologist.  Denies smoking history.    Medication List: Current Outpatient Medications  Medication Sig Dispense Refill  . ALPRAZolam (XANAX) 0.5 MG tablet Take 0.5 mg by mouth 3 (three) times daily as needed.     No current facility-administered medications for this visit.    Known medication allergies: No Known Allergies   Physical examination: Blood pressure 110/72, pulse 75, temperature 98.5 F (36.9 C), temperature source Temporal, resp. rate 18, height 5' 8.4" (1.737 m), weight 161 lb 9.6 oz (73.3 kg), SpO2 100 %    General: Alert, interactive, in no acute distress. HEENT: PERRLA, TMs pearly gray, turbinates moderately edematous with clear discharge, post-pharynx non erythematous. Neck: Supple without lymphadenopathy. Lungs: Clear to auscultation without wheezing, rhonchi or rales. {no increased work of breathing. CV: Normal S1, S2 without murmurs. Abdomen: Nondistended, nontender. Skin: Hyperpigmented scarred lesions indicative of acne vulgaris on the face. Extremities:  No clubbing, cyanosis or edema. Neuro:  Grossly intact.  Diagnositics/Labs: Allergy testing: select food allergy skin prick testing is positive to wheat and milk.  Negative to casein. Environmental allergy skin prick testing is positive to grass pollen, weed pollen, tree pollens, mold, dust mites, cat, mixed feathers and mouse Allergy testing results were read and interpreted by provider, documented by clinical staff.   Assessment and plan: Adverse food reaction Acne vulgaris  -food allergy testing is positive to wheat and milk.  Negative to casein.  You should be able to tolerate baked milk products in the  diet. -Recommend avoidance of milk and wheat products in the diet - have access to self-injectable epinephrine (Epipen or AuviQ) 0.3mg  at all times - follow emergency action plan in case of allergic reaction  -we have discussed the following in regards to foods:   Allergy: food allergy is when you have eaten a food, developed an allergic reaction after eating the food and have IgE to the food (positive food testing either by skin testing or blood testing).  Food allergy could lead to life threatening symptoms  Sensitivity: occurs when you have IgE to a food (positive food testing either by skin testing or blood testing) but is a food you eat without any issues.  This is not an allergy and we recommend keeping the food in the diet  Intolerance: this is when you have negative testing by either skin testing or blood testing thus not allergic but the food causes symptoms (like belly pain, bloating, diarrhea etc) with ingestion.  These foods should be avoided to prevent symptoms.    Allergic rhinitis with conjunctivitis -environmental allergy testing is positive to grass pollen, weed pollen, tree pollen, molds, dust mites, cat, mixed feathers and mouse. -allergen avoidance measures discussed/handouts provided -continue use of long-acting antihistamine like Claritin 10mg , Zyrtec 10mg , Xyzal 5mg  or Allegra 180mg  daily as needed for general allergy symptom control -for itchy/watery eyes use Olopatadine 0.2% 1 drop each eye daily as needed -for nasal congestion/drainage use Flonase 2 sprays each nostril daily for 1-2 weeks at a time before stopping once nasal congestion improves for maximum benefit  Eczema -keep skin moisturized with emollients like Vaseline or Aquafor daily at least and especially after bathing  Follow-up in 6-12 months or sooner if needed I appreciate the opportunity to take part in Melis's care. Please do not hesitate to contact me with questions.  Sincerely,   Prudy Feeler, MD Allergy/Immunology [facility]

## 2020-06-16 NOTE — Patient Instructions (Addendum)
-  food allergy testing is positive to wheat and milk.  Negative to casein.  You should be able to tolerate baked milk products in the diet. -Recommend avoidance of milk and wheat products in the diet - have access to self-injectable epinephrine (Epipen or AuviQ) 0.3mg  at all times - follow emergency action plan in case of allergic reaction  -we have discussed the following in regards to foods:   Allergy: food allergy is when you have eaten a food, developed an allergic reaction after eating the food and have IgE to the food (positive food testing either by skin testing or blood testing).  Food allergy could lead to life threatening symptoms  Sensitivity: occurs when you have IgE to a food (positive food testing either by skin testing or blood testing) but is a food you eat without any issues.  This is not an allergy and we recommend keeping the food in the diet  Intolerance: this is when you have negative testing by either skin testing or blood testing thus not allergic but the food causes symptoms (like belly pain, bloating, diarrhea etc) with ingestion.  These foods should be avoided to prevent symptoms.    -environmental allergy testing is positive to grass pollen, weed pollen, tree pollen, molds, dust mites, cat, mixed feathers and mouse. -allergen avoidance measures discussed/handouts provided -continue use of long-acting antihistamine like Claritin 10mg , Zyrtec 10mg , Xyzal 5mg  or Allegra 180mg  daily as needed for general allergy symptom control -for itchy/watery eyes use Olopatadine 0.2% 1 drop each eye daily as needed -for nasal congestion/drainage use Flonase 2 sprays each nostril daily for 1-2 weeks at a time before stopping once nasal congestion improves for maximum benefit  -keep skin moisturized with emollients like Vaseline or Aquafor daily at least and especially after bathing  Follow-up in 6-12 months or sooner if needed

## 2020-07-21 ENCOUNTER — Other Ambulatory Visit: Payer: Self-pay

## 2020-07-21 ENCOUNTER — Ambulatory Visit (HOSPITAL_COMMUNITY)
Admission: EM | Admit: 2020-07-21 | Discharge: 2020-07-22 | Disposition: A | Discharge status: Home / Self Care | Payer: Medicaid Other | Attending: Psychiatry | Admitting: Psychiatry

## 2020-07-21 DIAGNOSIS — Z20822 Contact with and (suspected) exposure to covid-19: Secondary | ICD-10-CM | POA: Insufficient documentation

## 2020-07-21 DIAGNOSIS — F4323 Adjustment disorder with mixed anxiety and depressed mood: Secondary | ICD-10-CM | POA: Diagnosis not present

## 2020-07-21 DIAGNOSIS — F4310 Post-traumatic stress disorder, unspecified: Secondary | ICD-10-CM | POA: Insufficient documentation

## 2020-07-21 DIAGNOSIS — Z9151 Personal history of suicidal behavior: Secondary | ICD-10-CM | POA: Insufficient documentation

## 2020-07-21 DIAGNOSIS — Z79899 Other long term (current) drug therapy: Secondary | ICD-10-CM | POA: Diagnosis not present

## 2020-07-21 LAB — ETHANOL: Alcohol, Ethyl (B): <10 mg/dL (ref <10)

## 2020-07-21 LAB — RESP PANEL BY RT-PCR (FLU A&B, COVID) ARPGX2
Influenza A by PCR: NEGATIVE
Influenza B by PCR: NEGATIVE
SARS Coronavirus 2 by RT PCR: NEGATIVE

## 2020-07-21 LAB — POCT URINE DRUG SCREEN - MANUAL ENTRY (I-SCREEN)
POC Amphetamine UR: NOT DETECTED
POC Buprenorphine (BUP): NOT DETECTED
POC Cocaine UR: NOT DETECTED
POC Marijuana UR: NOT DETECTED
POC Methadone UR: NOT DETECTED
POC Methamphetamine UR: NOT DETECTED
POC Morphine: NOT DETECTED
POC Oxazepam (BZO): NOT DETECTED
POC Oxycodone UR: NOT DETECTED
POC Secobarbital (BAR): NOT DETECTED

## 2020-07-21 LAB — COMPREHENSIVE METABOLIC PANEL
ALT: 17 U/L (ref 0–44)
AST: 22 U/L (ref 15–41)
Albumin: 3.9 g/dL (ref 3.5–5.0)
Alkaline Phosphatase: 40 U/L (ref 38–126)
Anion gap: 8 (ref 5–15)
BUN: 11 mg/dL (ref 6–20)
CO2: 23 mmol/L (ref 22–32)
Calcium: 9.6 mg/dL (ref 8.9–10.3)
Chloride: 107 mmol/L (ref 98–111)
Creatinine, Ser: 0.97 mg/dL (ref 0.44–1.00)
GFR, Estimated: >60 mL/min (ref >60)
Glucose, Bld: 85 mg/dL (ref 70–99)
Potassium: 4.4 mmol/L (ref 3.5–5.1)
Sodium: 138 mmol/L (ref 135–145)
Total Bilirubin: 0.4 mg/dL (ref 0.3–1.2)
Total Protein: 7.3 g/dL (ref 6.5–8.1)

## 2020-07-21 LAB — CBC WITH DIFFERENTIAL/PLATELET
Abs Immature Granulocytes: 0.02 10*3/uL (ref 0.00–0.07)
Basophils Absolute: 0.1 10*3/uL (ref 0.0–0.1)
Basophils Relative: 1 %
Eosinophils Absolute: 0.5 10*3/uL (ref 0.0–0.5)
Eosinophils Relative: 5 %
HCT: 42.6 % (ref 36.0–46.0)
Hemoglobin: 14 g/dL (ref 12.0–15.0)
Immature Granulocytes: 0 %
Lymphocytes Relative: 21 %
Lymphs Abs: 1.9 10*3/uL (ref 0.7–4.0)
MCH: 28.3 pg (ref 26.0–34.0)
MCHC: 32.9 g/dL (ref 30.0–36.0)
MCV: 86.2 fL (ref 80.0–100.0)
Monocytes Absolute: 0.5 10*3/uL (ref 0.1–1.0)
Monocytes Relative: 5 %
Neutro Abs: 6.4 10*3/uL (ref 1.7–7.7)
Neutrophils Relative %: 68 %
Platelets: 272 10*3/uL (ref 150–400)
RBC: 4.94 MIL/uL (ref 3.87–5.11)
RDW: 13.3 % (ref 11.5–15.5)
WBC: 9.4 10*3/uL (ref 4.0–10.5)
nRBC: 0 % (ref 0.0–0.2)

## 2020-07-21 LAB — URINALYSIS, ROUTINE W REFLEX MICROSCOPIC
Bilirubin Urine: NEGATIVE
Glucose, UA: NEGATIVE mg/dL
Hgb urine dipstick: NEGATIVE
Ketones, ur: NEGATIVE mg/dL
Leukocytes,Ua: NEGATIVE
Nitrite: NEGATIVE
Protein, ur: NEGATIVE mg/dL
Specific Gravity, Urine: 1.028 (ref 1.005–1.030)
pH: 5 (ref 5.0–8.0)

## 2020-07-21 LAB — TSH: TSH: 1.178 u[IU]/mL (ref 0.350–4.500)

## 2020-07-21 LAB — POC SARS CORONAVIRUS 2 AG -  ED: SARS Coronavirus 2 Ag: NEGATIVE

## 2020-07-21 LAB — LIPID PANEL
Cholesterol: 198 mg/dL (ref 0–200)
HDL: 57 mg/dL (ref >40)
LDL Cholesterol: 111 mg/dL — ABNORMAL HIGH (ref 0–99)
Total CHOL/HDL Ratio: 3.5 RATIO
Triglycerides: 148 mg/dL (ref <150)
VLDL: 30 mg/dL (ref 0–40)

## 2020-07-21 LAB — POCT PREGNANCY, URINE: Preg Test, Ur: NEGATIVE

## 2020-07-21 MED ORDER — ESCITALOPRAM OXALATE 10 MG PO TABS
10.0000 mg | ORAL_TABLET | Freq: Every day | ORAL | Status: DC
  Administered 2020-07-21 – 2020-07-22 (×2): 10 mg via ORAL
  Filled 2020-07-21 (×2): qty 1

## 2020-07-21 MED ORDER — ACETAMINOPHEN 325 MG PO TABS: 650.0000 mg | ORAL_TABLET | Freq: Four times a day (QID) | ORAL | Status: DC | PRN

## 2020-07-21 MED ORDER — HYDROXYZINE HCL 25 MG PO TABS: 25.0000 mg | ORAL_TABLET | Freq: Three times a day (TID) | ORAL | Status: DC | PRN

## 2020-07-21 MED ORDER — MAGNESIUM HYDROXIDE 400 MG/5ML PO SUSP: 30.0000 mL | Freq: Every day | ORAL | Status: DC | PRN

## 2020-07-21 MED ORDER — ALPRAZOLAM 0.5 MG PO TABS: 0.5000 mg | ORAL_TABLET | Freq: Every day | ORAL | Status: DC | PRN

## 2020-07-21 MED ORDER — TRAZODONE HCL 50 MG PO TABS: 50.0000 mg | ORAL_TABLET | Freq: Every evening | ORAL | Status: DC | PRN

## 2020-07-21 MED ORDER — ALUM & MAG HYDROXIDE-SIMETH 200-200-20 MG/5ML PO SUSP: 30.0000 mL | ORAL | Status: DC | PRN

## 2020-07-21 NOTE — Progress Notes (Signed)
Received [place] in the OBS area after the admission process. She was cooperative with the admission process. She endorsed feeling suicidal prior to this admission, feeling depresses and anxious. She received nourishments and now resting in her chair bed without distress.

## 2020-07-21 NOTE — ED Notes (Signed)
Pt is currently sleeping, breathing even and unlabored, environment check complete/secure, will continue to monitor patient for safety
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
## 2020-07-21 NOTE — BH Assessment (Signed)
Triage note- EMERGENT- Pt reports SI with plan to overdose on pills. Hx of a past suicide attempt at 30 yo.

## 2020-07-22 MED ORDER — ESCITALOPRAM OXALATE 10 MG PO TABS: 10.0000 mg | ORAL_TABLET | Freq: Every day | ORAL | 0 refills | Status: DC

## 2020-07-22 NOTE — BHH Counselor (Signed)
CSW provided pt with housing resources and aftercare mental health referrals were input into her after visit summary.  Sue Young, MSW, LCSW-A 6/28/202210:58 AM

## 2020-07-22 NOTE — ED Notes (Signed)
Awake getting vitals taken by MHT. No new issues noted or reported. Will continue to monitor for safety

## 2020-07-22 NOTE — ED Notes (Signed)
Pt given breakfast.

## 2020-07-22 NOTE — ED Notes (Signed)
Educated pt on d/c instructions and medications. Verbalized understanding. Ambulated per self to retrieve belongings. Escorted to front lobby and d/c under own care. A&O x4. No SI, HI, or AVH. No s/s pain, discomfort, or acute distress. Stable at time of d/c

## 2020-07-22 NOTE — ED Provider Notes (Signed)
[facility]/OBS ASAP Discharge Summary  Date and Time: 07/22/2020 8:23 AM  Name: Sue Young  MRN:  [id number]   Discharge Diagnoses:  Final diagnoses:  Adjustment disorder with mixed anxiety and depressed mood    Subjective: She reports that she is doing better today. She reports that she was able to sleep through the night and that her appetite was good. She reported she had a mild headache overnight but it had already resolved. She reported no SI, HI, or AVH. She reported that she thinks the SI is situational and now that she is out of it can contract for safety. She reports tolerating the Lexapro well. She asked about what the next steps are. Discussed she would be provided with a month of medication and an outpatient appointment would be made for approximately 2-3 weeks to continue medication management. She asked about length of time on medication and discussed that she would need to remain on it for at least 6-8 months and then the outpatient provider would work with her about tapering. Re-enforced taking the medication daily even when feeling good and that it will take 4-6 weeks to feel the full effect of the medication. She reported understanding and had no other concerns.  Stay Summary: Patient presented on 6/27 to the [facility] voluntarily due to increasing SI. She reported she felt overwhelmed and was willing to start medication. She was started on Lexapro and tolerated it well. She slept through the night without incident. On 6/28 she reported feeling better and could contract for safety. She reported no SI, HI, or AVH. Follow up was arranged and she was discharged home.  Total Time spent with patient: 20 minutes  Past Psychiatric History: Previous suicide attempts, Remote history of cutting. Past Medical History:  Past Medical History:  Diagnosis Date   Anemia    Angio-edema    BV (bacterial vaginosis)    Chlamydia    Eczema    Gonorrhea    No pertinent past medical history     Recurrent upper respiratory infection (URI)    Thrombocytopenia (HCC)     Past Surgical History:  Procedure Laterality Date   NO PAST SURGERIES     Family History:  Family History  Problem Relation Age of Onset   Diabetes Mother    Asthma Brother    Eczema Brother    Allergic rhinitis Neg Hx    Urticaria Neg Hx    Family Psychiatric History: None reported Social History:  Social History   Substance and Sexual Activity  Alcohol Use No     Social History   Substance and Sexual Activity  Drug Use No    Social History   Socioeconomic History   Marital status: Legally Separated    Spouse name: Not on file   Number of children: Not on file   Years of education: Not on file   Highest education level: Not on file  Occupational History   Not on file  Tobacco Use   Smoking status: Never   Smokeless tobacco: Never  Vaping Use   Vaping Use: Never used  Substance and Sexual Activity   Alcohol use: No   Drug use: No   Sexual activity: Yes    Birth control/protection: I.U.D.  Other Topics Concern   Not on file  Social History Narrative   Not on file   Social Determinants of Health   Financial Resource Strain: Not on file  Food Insecurity: Not on file  Transportation Needs: Not on file  Physical Activity: Not on file  Stress: Not on file  Social Connections: Not on file   SDOH:  SDOH Screenings   Alcohol Screen: Not on file  Depression (PHQ2-9): Not on file  Financial Resource Strain: Not on file  Food Insecurity: Not on file  Housing: Not on file  Physical Activity: Not on file  Social Connections: Not on file  Stress: Not on file  Tobacco Use: Low Risk    Smoking Tobacco Use: Never   Smokeless Tobacco Use: Never  Transportation Needs: Not on file    Tobacco Cessation:  N/A, patient does not currently use tobacco products  Current Medications:  Current Facility-Administered Medications  Medication Dose Route Frequency Provider Last Rate Last Admin    acetaminophen (TYLENOL) tablet 650 mg  650 mg Oral Q6H PRN Ival Bible, MD       ALPRAZolam Duanne Moron) tablet 0.5 mg  0.5 mg Oral Daily PRN Ival Bible, MD       alum & mag hydroxide-simeth (MAALOX/MYLANTA) 200-200-20 MG/5ML suspension 30 mL  30 mL Oral Q4H PRN Ival Bible, MD       escitalopram (LEXAPRO) tablet 10 mg  10 mg Oral Daily Ival Bible, MD   10 mg at 07/21/20 1717   hydrOXYzine (ATARAX/VISTARIL) tablet 25 mg  25 mg Oral TID PRN Ival Bible, MD       magnesium hydroxide (MILK OF MAGNESIA) suspension 30 mL  30 mL Oral Daily PRN Ival Bible, MD       traZODone (DESYREL) tablet 50 mg  50 mg Oral QHS PRN Ival Bible, MD       Current Outpatient Medications  Medication Sig Dispense Refill   ALPRAZolam (XANAX) 0.5 MG tablet Take 0.5 mg by mouth 3 (three) times daily as needed.     EPINEPHrine 0.3 mg/0.3 mL IJ SOAJ injection Inject 0.3 mg into the muscle as needed for anaphylaxis. 1 each 2   fluticasone (FLONASE) 50 MCG/ACT nasal spray 2 sprays each nostril daily for 1-2 weeks at a time before stopping once nasal congestion/drainage improves for maximum benefit. 16 g 5   Olopatadine HCl 0.2 % SOLN 1 drop each eye daily as needed for itchy/watery eyes. 2.5 mL 5    PTA Medications: (Not in a hospital admission)   Musculoskeletal  Strength & Muscle Tone: within normal limits Gait & Station: normal Patient leans: N/A  Psychiatric Specialty Exam  Presentation  General Appearance: Appropriate for Environment; Casual  Eye Contact:Fair  Speech:Clear and Coherent; Normal Rate  Speech Volume:Normal  Handedness: No data recorded  Mood and Affect  Mood:Dysphoric  Affect:Appropriate; Congruent   Thought Process  Thought Processes:Coherent; Goal Directed  Descriptions of Associations:Intact  Orientation:Full (Time, Place and Person)  Thought Content:WDL  Diagnosis of Schizophrenia or Schizoaffective disorder in  past: No    Hallucinations:Hallucinations: None  Ideas of Reference:None  Suicidal Thoughts:Suicidal Thoughts: No SI Active Intent and/or Plan: With Intent; With Plan  Homicidal Thoughts:Homicidal Thoughts: No   Sensorium  Memory:Immediate Fair; Recent Fair  Judgment:Fair  Insight:Fair   Executive Functions  Concentration:Good  Attention Span:Good  [place]  Language:Good   Psychomotor Activity  Psychomotor Activity:Psychomotor Activity: Normal   Assets  Assets:Communication Skills; Desire for Improvement; Resilience; Physical Health   Sleep  Sleep:Sleep: Fair   Nutritional Assessment (For OBS and FBC admissions only) Has the patient had a weight loss or gain of 10 pounds or more in the last 3 months?: No  Has the patient had a decrease in food intake/or appetite?: Yes Does the patient have dental problems?: No Does the patient have eating habits or behaviors that may be indicators of an eating disorder including binging or inducing vomiting?: No Has the patient recently lost weight without trying?: No Has the patient been eating poorly because of a decreased appetite?: Yes Malnutrition Screening Tool Score: 1   Physical Exam  Physical Exam Vitals and nursing note reviewed.  Constitutional:      General: She is not in acute distress.    Appearance: Normal appearance. She is normal weight. She is not ill-appearing.  HENT:     Head: Normocephalic and atraumatic.  Cardiovascular:     Rate and Rhythm: Normal rate.  Pulmonary:     Effort: Pulmonary effort is normal.  Musculoskeletal:        General: Normal range of motion.  Neurological:     Mental Status: She is alert.   Review of Systems  Constitutional:  Negative for chills.  Respiratory:  Negative for cough and shortness of breath.   Cardiovascular:  Negative for chest pain.  Gastrointestinal:  Negative for abdominal pain, constipation, diarrhea, nausea and vomiting.   Neurological:  Negative for weakness and headaches.  Psychiatric/Behavioral:  Negative for depression, hallucinations and suicidal ideas. The patient is not nervous/anxious.   Blood pressure (!) 139/91, pulse 80, temperature 98.9 F (37.2 C), temperature source Oral, resp. rate 16, SpO2 100 %, unknown if currently breastfeeding. There is no height or weight on file to calculate BMI.  Demographic Factors:  NA  Loss Factors: NA  Historical Factors: NA  Risk Reduction Factors:   Responsible for children under 24 years of age, Sense of responsibility to family, Positive therapeutic relationship, and Positive coping skills or problem solving skills  Continued Clinical Symptoms:  NA  Cognitive Features That Contribute To Risk:  None    Suicide Risk:  Minimal: No identifiable suicidal ideation.  Patients presenting with no risk factors but with morbid ruminations; may be classified as minimal risk based on the severity of the depressive symptoms  Plan Of Care/Follow-up recommendations:  - Activity as tolerated. - Diet as recommended by PCP. - Keep all scheduled follow-up appointments as recommended.  Disposition: Discharge home  Briant Cedar, MD 07/22/2020, 8:23 AM

## 2020-07-22 NOTE — ED Notes (Signed)
Pt is currently sleeping, breathing is even and unlabored, environment check complete/secure will continue to monitor patient for safety

## 2020-07-22 NOTE — Discharge Instructions (Addendum)
Patient is instructed to take all prescribed medications as recommended. Report any side effects or adverse reactions to your outpatient psychiatrist. Patient is instructed to abstain from alcohol and illegal drugs while on prescription medications. In the event of worsening symptoms, patient is instructed to call the crisis hotline, 911, or go to the nearest emergency department for evaluation and treatment.   

## 2020-07-23 LAB — HEMOGLOBIN A1C
Hgb A1c MFr Bld: 5.2 % (ref 4.8–5.6)
Mean Plasma Glucose: 103 mg/dL

## 2020-10-29 IMAGING — CT CT ABD-PELV W/ CM
2 of 4 series · 16 of 46 positions shown, 18 images · IV contrast (Omni 300)
Comparison: None.

CLINICAL DATA: Abdominal pain, pelvic pain, body aches

EXAM:
CT ABDOMEN AND PELVIS WITH CONTRAST
TECHNIQUE: Multidetector CT imaging of the abdomen and pelvis was performed
using the standard protocol following bolus administration of
intravenous contrast.
CONTRAST:  100mL OMNIPAQUE IOHEXOL 300 MG/ML  SOLN

[Series 3: a/p w/ 5mm · axial · 0.61mm/px · z∈[+706,+1112]mm · 13 of 89 slices shown, 15 images]
[im 4/89  soft-tissue]
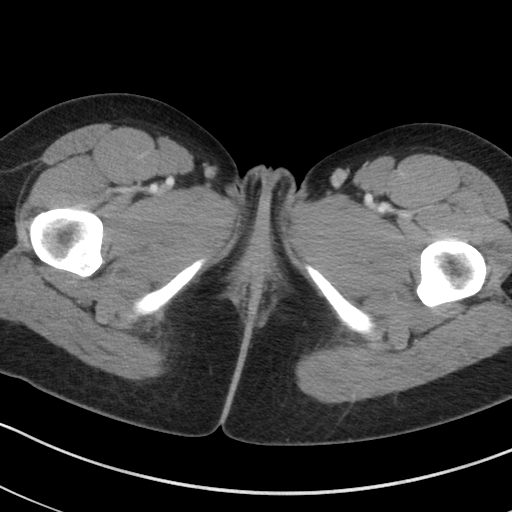
[im 4/89  bone]
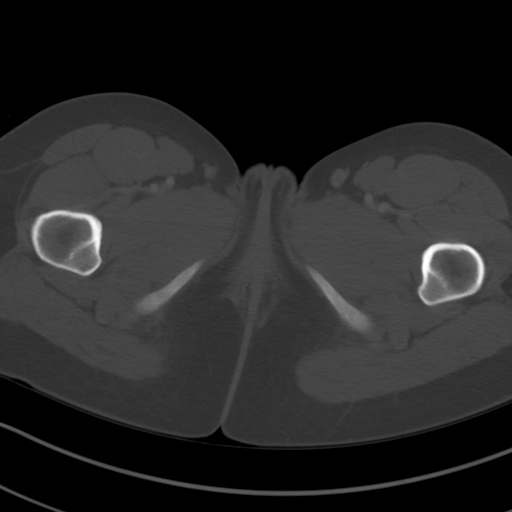
[im 11/89  soft-tissue]
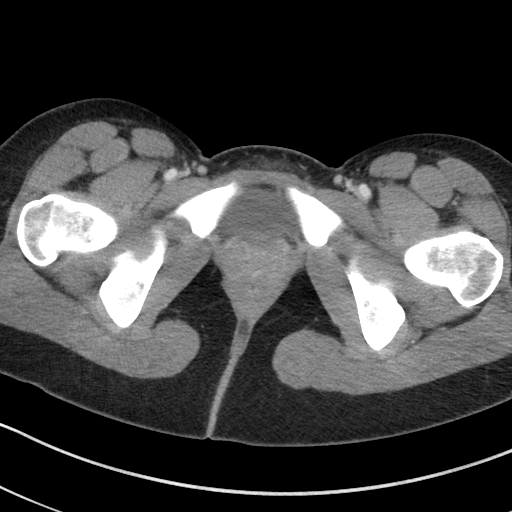
[im 17/89  soft-tissue]
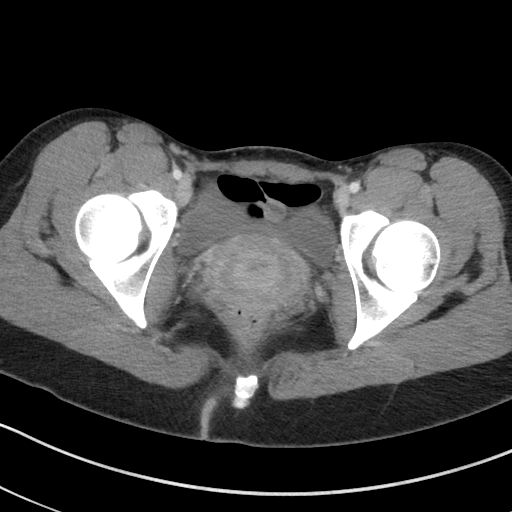
[im 24/89  soft-tissue]
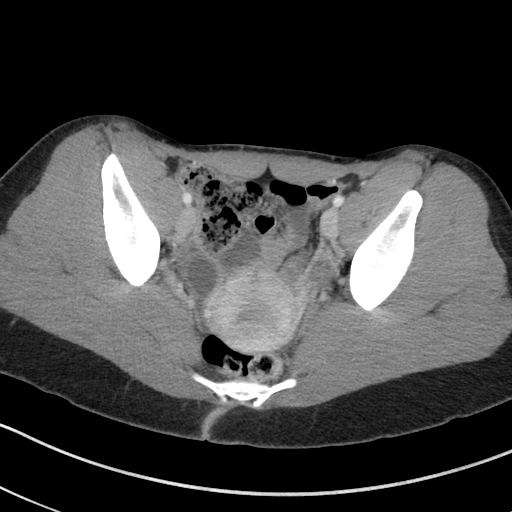
[im 31/89  soft-tissue]
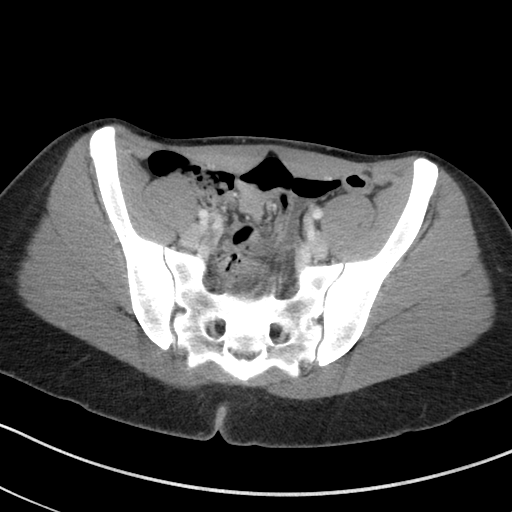
[im 38/89  soft-tissue]
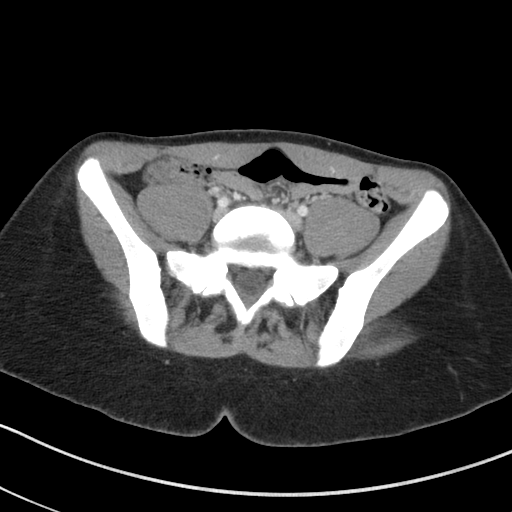
[im 45/89  soft-tissue]
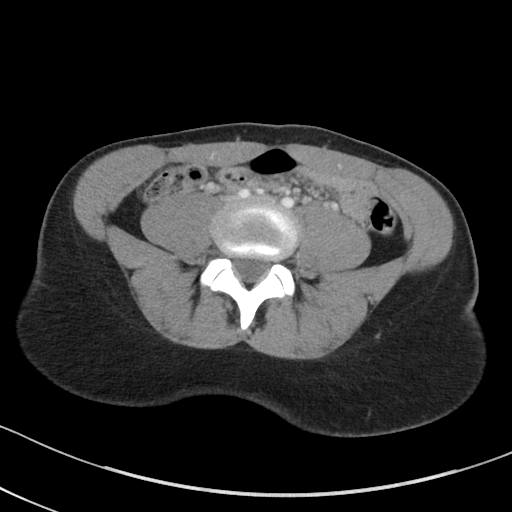
[im 51/89  soft-tissue]
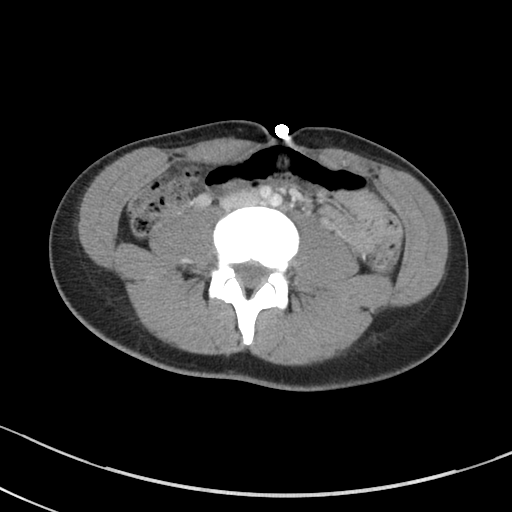
[im 58/89  soft-tissue]
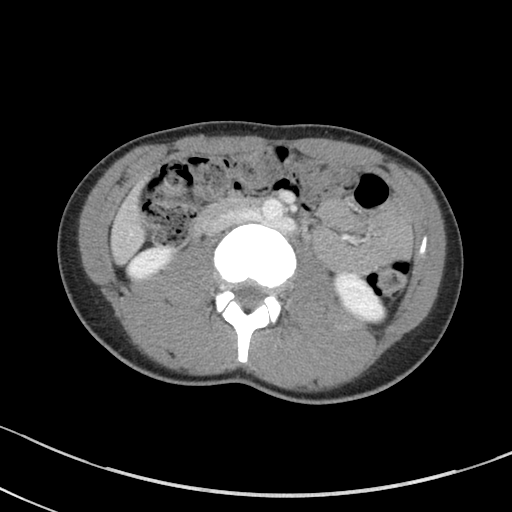
[im 58/89  bone]
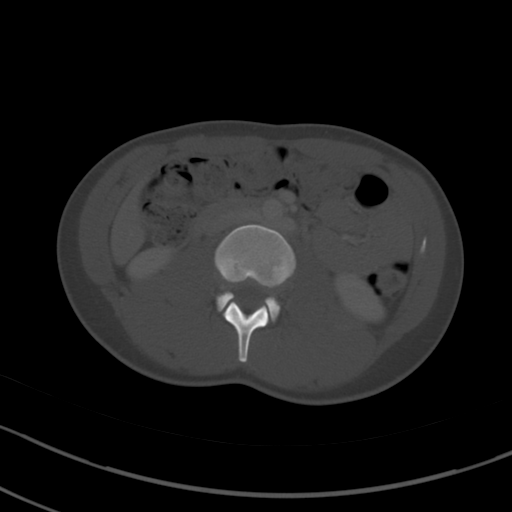
[im 65/89  soft-tissue]
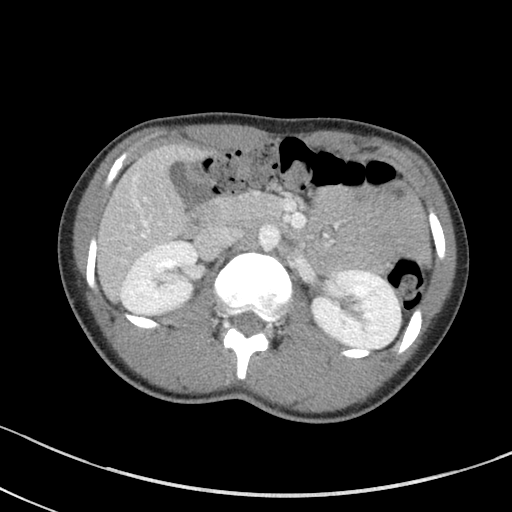
[im 72/89  soft-tissue]
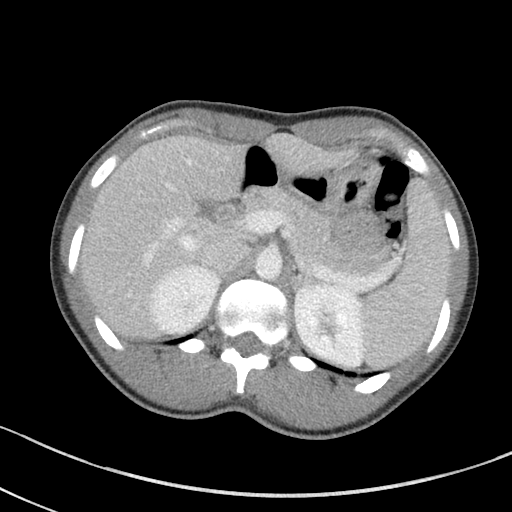
[im 78/89  soft-tissue]
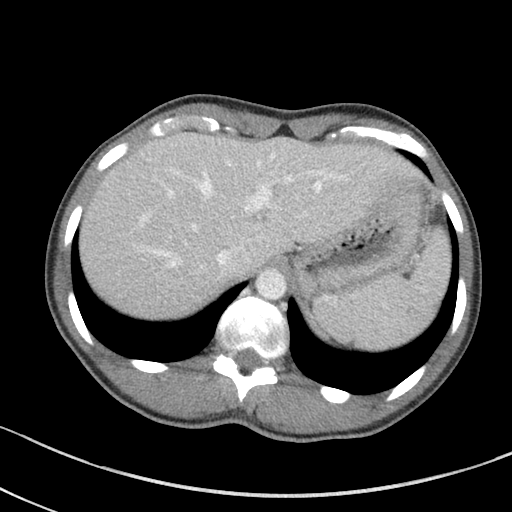
[im 85/89  soft-tissue]
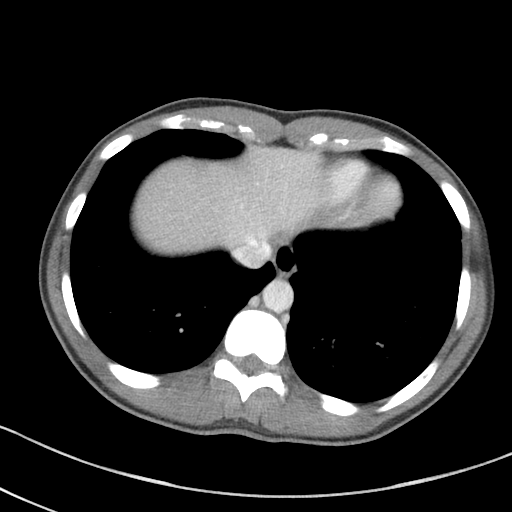

[Series 6: a/p w/ cor · coronal · 0.65mm/px · 3 of 103 slices shown]
[im 35/103  soft-tissue]
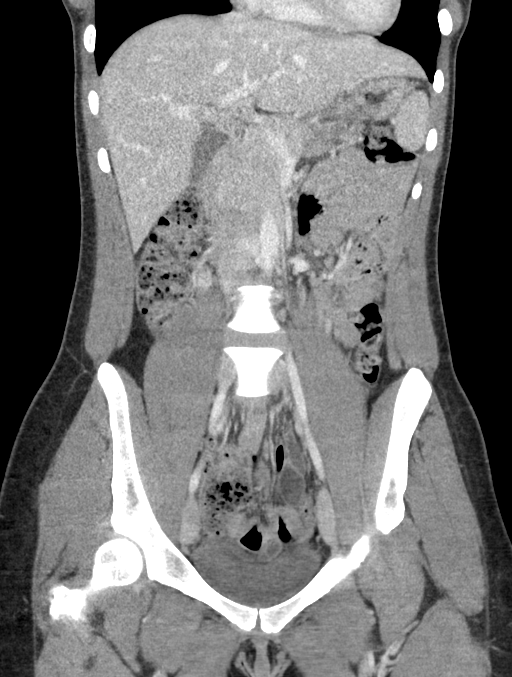
[im 46/103  soft-tissue]
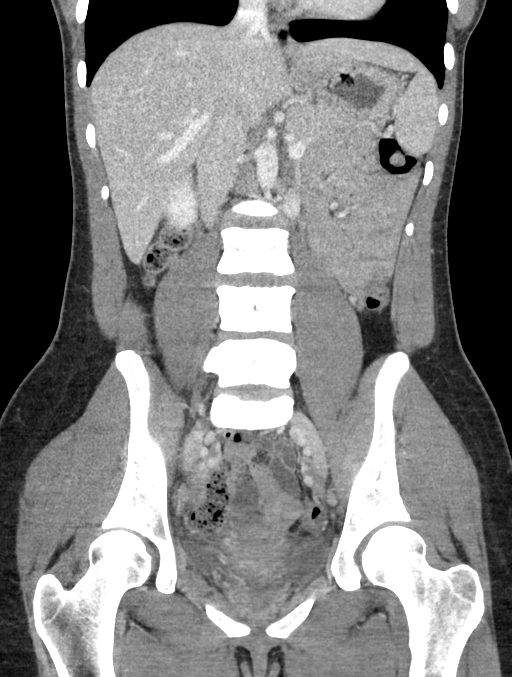
[im 57/103  soft-tissue]
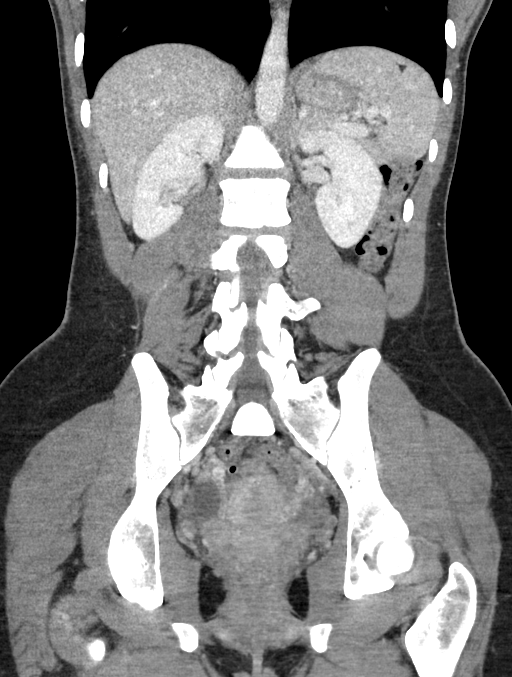

[16 of 46 positions shown; findings below may reference images not displayed]

FINDINGS: Lower chest: No acute abnormality.

Hepatobiliary: No focal liver abnormality is seen. No gallstones,
gallbladder wall thickening, or biliary dilatation.

Pancreas: Unremarkable. No pancreatic ductal dilatation or
surrounding inflammatory changes.

Spleen: Normal in size without focal abnormality.

Adrenals/Urinary Tract: Adrenal glands are unremarkable. Kidneys are
normal, without renal calculi, focal lesion, or hydronephrosis.
Bladder is unremarkable.

Stomach/Bowel: Stomach is within normal limits. Appendix appears
normal. No evidence of bowel wall thickening, distention, or
inflammatory changes.

Vascular/Lymphatic: No significant vascular findings are present.
Incidental note of variant retroaortic left renal vein. No enlarged
abdominal or pelvic lymph nodes.

Reproductive: No mass or other abnormality.

Other: No abdominal wall hernia or abnormality. Small volume
nonspecific free fluid in the low pelvis.

Musculoskeletal: No acute or significant osseous findings.
IMPRESSION: 1. Small volume nonspecific free fluid in the low pelvis, likely
functional in the reproductive age setting. There are no definite
acute CT findings of the abdomen or pelvis to explain abdominal or
pelvic pain or body aches.

2.  Normal appendix.

## 2022-04-01 ENCOUNTER — Telehealth: Payer: Self-pay | Admitting: *Deleted

## 2022-04-01 NOTE — Telephone Encounter (Signed)
No note

## 2022-12-07 ENCOUNTER — Other Ambulatory Visit: Payer: Self-pay

## 2022-12-07 ENCOUNTER — Ambulatory Visit
Admission: EM | Admit: 2022-12-07 | Discharge: 2022-12-07 | Payer: Medicaid Other | Attending: Internal Medicine | Admitting: Internal Medicine

## 2022-12-07 ENCOUNTER — Emergency Department (HOSPITAL_COMMUNITY)
Admission: EM | Admit: 2022-12-07 | Discharge: 2022-12-07 | Disposition: A | Discharge status: Home / Self Care | Payer: Medicaid Other | Attending: Emergency Medicine | Admitting: Emergency Medicine

## 2022-12-07 ENCOUNTER — Emergency Department (HOSPITAL_COMMUNITY): Payer: Medicaid Other

## 2022-12-07 DIAGNOSIS — R030 Elevated blood-pressure reading, without diagnosis of hypertension: Secondary | ICD-10-CM

## 2022-12-07 DIAGNOSIS — J219 Acute bronchiolitis, unspecified: Secondary | ICD-10-CM | POA: Diagnosis not present

## 2022-12-07 DIAGNOSIS — R42 Dizziness and giddiness: Secondary | ICD-10-CM

## 2022-12-07 DIAGNOSIS — H539 Unspecified visual disturbance: Secondary | ICD-10-CM

## 2022-12-07 DIAGNOSIS — Z79899 Other long term (current) drug therapy: Secondary | ICD-10-CM | POA: Insufficient documentation

## 2022-12-07 DIAGNOSIS — J9801 Acute bronchospasm: Secondary | ICD-10-CM

## 2022-12-07 DIAGNOSIS — I10 Essential (primary) hypertension: Secondary | ICD-10-CM | POA: Diagnosis not present

## 2022-12-07 DIAGNOSIS — R519 Headache, unspecified: Secondary | ICD-10-CM

## 2022-12-07 DIAGNOSIS — R062 Wheezing: Secondary | ICD-10-CM | POA: Diagnosis not present

## 2022-12-07 LAB — CBC WITH DIFFERENTIAL/PLATELET
Abs Immature Granulocytes: 0.01 10*3/uL (ref 0.00–0.07)
Basophils Absolute: 0.1 10*3/uL (ref 0.0–0.1)
Basophils Relative: 1 %
Eosinophils Absolute: 0.7 10*3/uL — ABNORMAL HIGH (ref 0.0–0.5)
Eosinophils Relative: 7 %
HCT: 39.1 % (ref 36.0–46.0)
Hemoglobin: 12.9 g/dL (ref 12.0–15.0)
Immature Granulocytes: 0 %
Lymphocytes Relative: 24 %
Lymphs Abs: 2.4 10*3/uL (ref 0.7–4.0)
MCH: 28.7 pg (ref 26.0–34.0)
MCHC: 33 g/dL (ref 30.0–36.0)
MCV: 87.1 fL (ref 80.0–100.0)
Monocytes Absolute: 0.6 10*3/uL (ref 0.1–1.0)
Monocytes Relative: 6 %
Neutro Abs: 6.4 10*3/uL (ref 1.7–7.7)
Neutrophils Relative %: 62 %
Platelets: 205 10*3/uL (ref 150–400)
RBC: 4.49 MIL/uL (ref 3.87–5.11)
RDW: 14 % (ref 11.5–15.5)
WBC: 10.2 10*3/uL (ref 4.0–10.5)
nRBC: 0 % (ref 0.0–0.2)

## 2022-12-07 LAB — COMPREHENSIVE METABOLIC PANEL
ALT: 22 U/L (ref 0–44)
AST: 24 U/L (ref 15–41)
Albumin: 4.3 g/dL (ref 3.5–5.0)
Alkaline Phosphatase: 59 U/L (ref 38–126)
Anion gap: 8 (ref 5–15)
BUN: 10 mg/dL (ref 6–20)
CO2: 26 mmol/L (ref 22–32)
Calcium: 9.6 mg/dL (ref 8.9–10.3)
Chloride: 104 mmol/L (ref 98–111)
Creatinine, Ser: 0.85 mg/dL (ref 0.44–1.00)
GFR, Estimated: >60 mL/min (ref >60)
Glucose, Bld: 108 mg/dL — ABNORMAL HIGH (ref 70–99)
Potassium: 4.3 mmol/L (ref 3.5–5.1)
Sodium: 138 mmol/L (ref 135–145)
Total Bilirubin: 0.5 mg/dL (ref <1.2)
Total Protein: 8 g/dL (ref 6.5–8.1)

## 2022-12-07 MED ORDER — ALBUTEROL SULFATE HFA 108 (90 BASE) MCG/ACT IN AERS: 1.0000 | INHALATION_SPRAY | Freq: Four times a day (QID) | RESPIRATORY_TRACT | Status: DC | PRN

## 2022-12-07 MED ORDER — ACETAMINOPHEN 500 MG PO TABS
1000.0000 mg | ORAL_TABLET | Freq: Once | ORAL | Status: AC
  Administered 2022-12-07: 1000 mg via ORAL
  Filled 2022-12-07: qty 2

## 2022-12-07 MED ORDER — PREDNISONE 20 MG PO TABS
60.0000 mg | ORAL_TABLET | Freq: Once | ORAL | Status: AC
  Administered 2022-12-07: 60 mg via ORAL
  Filled 2022-12-07: qty 3

## 2022-12-07 MED ORDER — PREDNISONE 10 MG PO TABS: 40.0000 mg | ORAL_TABLET | Freq: Every day | ORAL | 0 refills | Status: DC

## 2022-12-07 MED ORDER — IPRATROPIUM-ALBUTEROL 0.5-2.5 (3) MG/3ML IN SOLN
3.0000 mL | Freq: Once | RESPIRATORY_TRACT | Status: AC
  Administered 2022-12-07: 3 mL via RESPIRATORY_TRACT
  Filled 2022-12-07: qty 3

## 2022-12-07 MED ORDER — ALBUTEROL SULFATE HFA 108 (90 BASE) MCG/ACT IN AERS: 1.0000 | INHALATION_SPRAY | Freq: Four times a day (QID) | RESPIRATORY_TRACT | 0 refills | Status: DC | PRN

## 2022-12-07 NOTE — ED Provider Notes (Signed)
[facility]    CSN: [id number] Arrival date & time: 12/07/22  1220      History   Chief Complaint Chief Complaint  Patient presents with   Hypertension   Shortness of Breath    HPI Sue Young is a 32 y.o. female presents for elevated blood pressure and shortness of breath.  Patient reports over the past couple days she has been having some dizziness with near syncopal episodes as well as overall weakness and feeling unsteady.  She also is reporting she has been having some wheezing without cough or URI symptoms.  Denies history of asthma.  She went to the ENT did a blood pressure at that time was 182/99.  She was advised to see her PCP.  When she called their office she was advised to go to urgent care or the ED.  Patient does endorse a frontal headache that began today as well as continued dizziness/feeling unsteady/lightheadedness, blurry vision to her left peripheral, wheezing and feeling that she needs to concentrate to take a deep breath.  No fevers or chest pain.  Wears glasses no contacts.  No history of hypertension.  No new medications.   Hypertension Associated symptoms include headaches and shortness of breath.  Shortness of Breath Associated symptoms: headaches and wheezing     Past Medical History:  Diagnosis Date   Anemia    Angio-edema    BV (bacterial vaginosis)    Chlamydia    Eczema    Gonorrhea    No pertinent past medical history    Recurrent upper respiratory infection (URI)    Thrombocytopenia (HCC)     Patient Active Problem List   Diagnosis Date Noted   Post-dates pregnancy, delivered, current hospitalization 05/24/2014   Labor and delivery indication for care or intervention 05/23/2014   Thrombocytopenia (HCC) 05/16/2014    Past Surgical History:  Procedure Laterality Date   NO PAST SURGERIES      OB History     Gravida  2   Para  2   Term  2   Preterm      AB      Living  2      SAB      IAB       Ectopic      Multiple  0   Live Births  2            Home Medications    Prior to Admission medications   Medication Sig Start Date End Date Taking? Authorizing Provider  ALPRAZolam Prudy Feeler) 0.5 MG tablet Take 0.5 mg by mouth 3 (three) times daily as needed. 03/27/20   [provider]  EPINEPHrine 0.3 mg/0.3 mL IJ SOAJ injection Inject 0.3 mg into the muscle as needed for anaphylaxis. 06/16/20   Marcelyn Bruins, MD  escitalopram (LEXAPRO) 10 MG tablet Take 1 tablet (10 mg total) by mouth daily. 07/23/20 08/22/20  Lauro Franklin, MD  fluticasone (FLONASE) 50 MCG/ACT nasal spray 2 sprays each nostril daily for 1-2 weeks at a time before stopping once nasal congestion/drainage improves for maximum benefit. 06/16/20   Marcelyn Bruins, MD  Olopatadine HCl 0.2 % SOLN 1 drop each eye daily as needed for itchy/watery eyes. 06/16/20   Marcelyn Bruins, MD    Family History Family History  Problem Relation Age of Onset   Diabetes Mother    Asthma Brother    Eczema Brother    Allergic rhinitis Neg Hx    Urticaria Neg  Hx     Social History Social History   Tobacco Use   Smoking status: Never   Smokeless tobacco: Never  Vaping Use   Vaping status: Never Used  Substance Use Topics   Alcohol use: No   Drug use: No     Allergies   Patient has no known allergies.   Review of Systems Review of Systems  Respiratory:  Positive for shortness of breath and wheezing.   Cardiovascular:        Elevated blood pressure  Neurological:  Positive for dizziness and headaches.     Physical Exam Triage Vital Signs ED Triage Vitals  Encounter Vitals Group     BP 12/07/22 1233 (!) 160/114     Systolic BP Percentile --      Diastolic BP Percentile --      Pulse Rate 12/07/22 1233 (!) 105     Resp 12/07/22 1233 17     Temp 12/07/22 1233 98 F (36.7 C)     Temp Source 12/07/22 1233 Oral     SpO2 12/07/22 1233 96 %     Weight --      Height --       Head Circumference --      Peak Flow --      Pain Score 12/07/22 1232 2     Pain Loc --      Pain Education --      Exclude from Growth Chart --    No data found.  Updated Vital Signs BP (!) 160/114 (BP Location: Right Arm)   Pulse (!) 105   Temp 98 F (36.7 C) (Oral)   Resp 17   LMP 11/25/2022 (Exact Date)   SpO2 96%   Breastfeeding No   Visual Acuity Right Eye Distance:   Left Eye Distance:   Bilateral Distance:    Right Eye Near:   Left Eye Near:    Bilateral Near:     Physical Exam Constitutional:      General: She is not in acute distress.    Appearance: Normal appearance. She is not ill-appearing, toxic-appearing or diaphoretic.  HENT:     Head: Normocephalic and atraumatic.     Right Ear: Tympanic membrane and ear canal normal.     Left Ear: Tympanic membrane and ear canal normal.     Mouth/Throat:     Mouth: Mucous membranes are moist.  Eyes:     General: Gaze aligned appropriately.     Extraocular Movements: Extraocular movements intact.     Conjunctiva/sclera: Conjunctivae normal.     Pupils: Pupils are equal, round, and reactive to light.     Comments: Patient does have reduced visibility to left peripheral compared to right  Cardiovascular:     Rate and Rhythm: Regular rhythm. Tachycardia present.     Heart sounds: Normal heart sounds.     Comments: Mildly tachycardia at 105 Pulmonary:     Effort: Pulmonary effort is normal.     Breath sounds: Wheezing present.  Skin:    General: Skin is warm and dry.  Neurological:     General: No focal deficit present.     Mental Status: She is alert and oriented to person, place, and time.     Cranial Nerves: No facial asymmetry.     Motor: No weakness or pronator drift.     Coordination: Romberg sign negative. Finger-Nose-Finger Test normal.  Psychiatric:        Mood and Affect: Mood normal.  Behavior: Behavior normal.      UC Treatments / Results  Labs (all labs ordered are listed, but  only abnormal results are displayed) Labs Reviewed - No data to display  EKG   Radiology No results found.  Procedures ED EKG  Date/Time: 12/07/2022 1:12 PM  Performed by: Radford Pax, NP Authorized by: Radford Pax, NP   ECG interpreted by ED Physician in the absence of a cardiologist: no   Previous ECG:    Previous ECG:  Unavailable Interpretation:    Interpretation: normal   Rate:    ECG rate:  76   ECG rate assessment: normal   Rhythm:    Rhythm: sinus rhythm   Ectopy:    Ectopy: none   ST segments:    ST segments:  Normal T waves:    T waves: normal   Q waves:    Abnormal Q-waves: not present    (including critical care time)  Medications Ordered in UC Medications - No data to display  Initial Impression / Assessment and Plan / UC Course  I have reviewed the triage vital signs and the nursing notes.  Pertinent labs & imaging results that were available during my care of the patient were reviewed by me and considered in my medical decision making (see chart for details).     I reviewed exam and symptoms with patient.  Discussed limitations and abilities of urgent care.  Patient presenting with acute onset of hypertension without history as well as headache, dizziness, visual changes,, wheezing/shortness of breath.  EKG unremarkable.  Advised patient go to the ER for further workup.  She is in agreement with plan and declined EMS transfer and will go POV with her boyfriend driving to [place], ER.  She was instructed to pull over and call 911 for any worsening symptoms that occur in transit and she verbalized understanding. Final Clinical Impressions(s) / UC Diagnoses   Final diagnoses:  Dizziness  Acute intractable headache, unspecified headache type  Vision changes  Wheezing  Elevated BP without diagnosis of hypertension   Discharge Instructions   None    ED Prescriptions   None    PDMP not reviewed this encounter.   Radford Pax,  NP 12/07/22 1314

## 2022-12-07 NOTE — ED Triage Notes (Signed)
Pt presents with c/o hbp and dizziness, wheezing X 3 days.   States she has been feeling weak over the last few days.

## 2022-12-07 NOTE — Discharge Instructions (Signed)
Return for any problem.  ?

## 2022-12-07 NOTE — Discharge Instructions (Signed)
Please go to the ER for further evaluation of your symptoms  

## 2022-12-07 NOTE — ED Provider Notes (Signed)
[place] EMERGENCY DEPARTMENT AT [facility] Provider Note   CSN: [id number] Arrival date & time: 12/07/22  1344     History  Chief Complaint  Patient presents with   Hypertension   Headache    Sue Young is a 32 y.o. female.  32 year old female with medical history detailed below presents for evaluation.  Patient reports that she was at the ENT clinic earlier today.  She was being evaluated for routine tonsillectomy.  She was noted to be hypertensive with a blood pressure in the 180s.  She reports that on her last clinic visit with her PCP several months ago she was noted to be borderline hypotensive.  She is not currently on antihypertensives.  She has not been started on antihypertensives previously.  After her ENT evaluation she contacted her PCP who recommended that she go to urgent care for evaluation.  At urgent care she was noted to be hypertensive and also to have mild diffuse expiratory wheezing.  Patient is without history of asthma.  Patient was sent to the ED for evaluation of wheezing and hypertension.  At the time of my evaluation, patient reports that she feels much improved.   The history is provided by the patient and medical records.       Home Medications Prior to Admission medications   Medication Sig Start Date End Date Taking? Authorizing Provider  ALPRAZolam Prudy Feeler) 0.5 MG tablet Take 0.5 mg by mouth 3 (three) times daily as needed. 03/27/20   [provider]  EPINEPHrine 0.3 mg/0.3 mL IJ SOAJ injection Inject 0.3 mg into the muscle as needed for anaphylaxis. 06/16/20   Marcelyn Bruins, MD  escitalopram (LEXAPRO) 10 MG tablet Take 1 tablet (10 mg total) by mouth daily. 07/23/20 08/22/20  Lauro Franklin, MD  fluticasone (FLONASE) 50 MCG/ACT nasal spray 2 sprays each nostril daily for 1-2 weeks at a time before stopping once nasal congestion/drainage improves for maximum benefit. 06/16/20   Marcelyn Bruins,  MD  Olopatadine HCl 0.2 % SOLN 1 drop each eye daily as needed for itchy/watery eyes. 06/16/20   Marcelyn Bruins, MD      Allergies    Patient has no known allergies.    Review of Systems   Review of Systems  All other systems reviewed and are negative.   Physical Exam Updated Vital Signs BP (!) 184/117 (BP Location: Right Arm)   Pulse 99   Temp 98.8 F (37.1 C) (Oral)   Resp 18   Ht 5\' 6"  (1.676 m)   Wt 82.1 kg   LMP 11/25/2022 (Exact Date)   SpO2 100%   BMI 29.21 kg/m  Physical Exam Vitals and nursing note reviewed.  Constitutional:      General: She is not in acute distress.    Appearance: Normal appearance. She is well-developed.  HENT:     Head: Normocephalic and atraumatic.  Eyes:     Conjunctiva/sclera: Conjunctivae normal.     Pupils: Pupils are equal, round, and reactive to light.  Cardiovascular:     Rate and Rhythm: Normal rate and regular rhythm.     Heart sounds: Normal heart sounds.  Pulmonary:     Effort: Pulmonary effort is normal. No respiratory distress.     Comments: Trace expiratory wheezes bilaterally Abdominal:     General: There is no distension.     Palpations: Abdomen is soft.     Tenderness: There is no abdominal tenderness.  Musculoskeletal:  General: No deformity. Normal range of motion.     Cervical back: Normal range of motion and neck supple.  Skin:    General: Skin is warm and dry.  Neurological:     General: No focal deficit present.     Mental Status: She is alert and oriented to person, place, and time.     ED Results / Procedures / Treatments   Labs (all labs ordered are listed, but only abnormal results are displayed) Labs Reviewed  CBC WITH DIFFERENTIAL/PLATELET - Abnormal; Notable for the following components:      Result Value   Eosinophils Absolute 0.7 (*)    All other components within normal limits  COMPREHENSIVE METABOLIC PANEL - Abnormal; Notable for the following components:   Glucose, Bld  108 (*)    All other components within normal limits    EKG EKG Interpretation Date/Time:  Tuesday December 07 2022 14:15:25 EST Ventricular Rate:  87 PR Interval:  156 QRS Duration:  77 QT Interval:  360 QTC Calculation: 433 R Axis:   59  Text Interpretation: Sinus rhythm Biatrial enlargement ST elev, probable normal early repol pattern Confirmed by Kristine Royal [phone]) on 12/07/2022 5:50:01 PM  Radiology DG Chest 2 View  Result Date: 12/07/2022 CLINICAL DATA:  Shortness of breath. EXAM: CHEST - 2 VIEW COMPARISON:  07/07/2010. FINDINGS: Bilateral lung fields are clear. Bilateral costophrenic angles are clear. Normal cardio-mediastinal silhouette. No acute osseous abnormalities. The soft tissues are within normal limits. IMPRESSION: *No active cardiopulmonary disease. Electronically Signed   By: Jules Schick M.D.   On: 12/07/2022 16:52    Procedures Procedures    Medications Ordered in ED Medications  ipratropium-albuterol (DUONEB) 0.5-2.5 (3) MG/3ML nebulizer solution 3 mL (has no administration in time range)  predniSONE (DELTASONE) tablet 60 mg (has no administration in time range)    ED Course/ Medical Decision Making/ A&P                                 Medical Decision Making Risk OTC drugs. Prescription drug management.    Medical Screen Complete  This patient presented to the ED with complaint of wheezing, elevated blood pressure.  This complaint involves an extensive number of treatment options. The initial differential diagnosis includes, but is not limited to, hypertensive emergency, metabolic abnormality, bronchospasm, pneumonia, etc.  This presentation is: Acute, Chronic, Self-Limited, Previously Undiagnosed, Uncertain Prognosis, Complicated, Systemic Symptoms, and Threat to Life/Bodily Function  Patient is presenting with 2 chief complaints.  Patient with primary complaint of wheezing and associated shortness of breath.  Patient also noted  significantly elevated blood pressure earlier today while at ENT clinic.  Patient without clearly established prior history of hypertension.  Patient given breathing treatment and prednisone.  Chest x-ray is without evidence of pneumonia or other abnormality.  Screening labs obtained are without significant abnormality.  After albuterol and prednisone administration the patient feels much improved.  Patient is nonhypoxic.  She is breathing comfortably.  Blood pressure, notably, is improved to 149/86 after improvement in patient's respiratory symptoms.  Just prior to discharge, patient's blood pressure is normalized at 139/85.  Patient would benefit from close outpatient follow-up with her already established PCP.  Importance of closely monitoring her home blood pressure is stressed.  Patient would benefit from short course of prednisone and albuterol MDI for treatment of likely bronchospasm.  Strict return precautions given and understood.  Importance of close follow-up is  stressed.  Additional history obtained:  External records from outside sources obtained and reviewed including prior ED visits and prior Inpatient records.    Lab Tests:  I ordered and personally interpreted labs.  The pertinent results include: CBC, CMP   Imaging Studies ordered:  I ordered imaging studies including chest x-ray I independently visualized and interpreted obtained imaging which showed NAD I agree with the radiologist interpretation.   Cardiac Monitoring:  The patient was maintained on a cardiac monitor.  I personally viewed and interpreted the cardiac monitor which showed an underlying rhythm of: NSR   Medicines ordered:  I ordered medication including Tylenol, DuoNeb treatment, prednisone for bronchospasm Reevaluation of the patient after these medicines showed that the patient: improved    Problem List / ED Course:  Bronchospasm, hypertension   Reevaluation:  After the interventions  noted above, I reevaluated the patient and found that they have: improved   Disposition:  After consideration of the diagnostic results and the patients response to treatment, I feel that the patent would benefit from close outpatient followup.          Final Clinical Impression(s) / ED Diagnoses Final diagnoses:  Bronchospasm  Hypertension, unspecified type    Rx / DC Orders ED Discharge Orders          Ordered    predniSONE (DELTASONE) 10 MG tablet  Daily        12/07/22 2101    albuterol (VENTOLIN HFA) 108 (90 Base) MCG/ACT inhaler  Every 6 hours PRN        12/07/22 2101              Wynetta Fines, MD 12/07/22 2255

## 2022-12-07 NOTE — ED Triage Notes (Signed)
Pt went to ENT this am, BP was 182/99, went to UC, BP was 160/114. No known BP issues. Been wheezing x3 days. Endorses HA and blurry vision starting today.

## 2022-12-07 NOTE — Progress Notes (Signed)
RT came to give neb tx ordered.  PT is currently eating and requested to not get her neb tx right now.  HR 88, sats 100% on RA.  BBS diminished with faint exp wheeze.  RN had pulled Duo neb medication.

## 2022-12-07 NOTE — ED Notes (Signed)
Patient is being discharged from the Urgent Care and sent to the Emergency Department via POV . Per Cheri Rous NP, patient is in need of higher level of care due to hypertension, blurred vision. Patient is aware and verbalizes understanding of plan of care.  Vitals:   12/07/22 1233  BP: (!) 160/114  Pulse: (!) 105  Resp: 17  Temp: 98 F (36.7 C)  SpO2: 96%

## 2022-12-07 NOTE — ED Provider Triage Note (Signed)
Emergency Medicine Provider Triage Evaluation Note  Sue Young , a 32 y.o. female  was evaluated in triage.  Pt complains of high blood pressure.  Review of Systems  Positive: HTN (no history), SOB, mild headache Negative: Vomiting, chest pain  Physical Exam  BP (!) 184/117 (BP Location: Right Arm)   Pulse 99   Temp 98.8 F (37.1 C) (Oral)   Resp 18   LMP 11/25/2022 (Exact Date)   SpO2 100%  Gen:   Awake, no distress   Resp:  Normal effort  MSK:   Moves extremities without difficulty No edema Other:    Medical Decision Making  Medically screening exam initiated at 2:02 PM.  Appropriate orders placed.  Sue Young was informed that the remainder of the evaluation will be completed by another provider, this initial triage assessment does not replace that evaluation, and the importance of remaining in the ED until their evaluation is complete.  Went to doctor's office today and blood pressure found to be elevated. Has SOB, mild headache. No new medications. No previously diagnosed HTN.    Sue Anis, PA-C 12/07/22 1405

## 2023-01-20 ENCOUNTER — Encounter (HOSPITAL_COMMUNITY): Payer: Self-pay

## 2023-01-20 ENCOUNTER — Other Ambulatory Visit: Payer: Self-pay

## 2023-01-20 ENCOUNTER — Emergency Department (HOSPITAL_COMMUNITY)
Admission: EM | Admit: 2023-01-20 | Discharge: 2023-01-20 | Disposition: A | Discharge status: Home / Self Care | Payer: Medicaid Other | Attending: Emergency Medicine | Admitting: Emergency Medicine

## 2023-01-20 DIAGNOSIS — N819 Female genital prolapse, unspecified: Secondary | ICD-10-CM | POA: Insufficient documentation

## 2023-01-20 DIAGNOSIS — Z91040 Latex allergy status: Secondary | ICD-10-CM | POA: Insufficient documentation

## 2023-01-20 DIAGNOSIS — K5900 Constipation, unspecified: Secondary | ICD-10-CM | POA: Insufficient documentation

## 2023-01-20 LAB — URINALYSIS, ROUTINE W REFLEX MICROSCOPIC
Bilirubin Urine: NEGATIVE
Glucose, UA: NEGATIVE mg/dL
Hgb urine dipstick: NEGATIVE
Ketones, ur: NEGATIVE mg/dL
Leukocytes,Ua: NEGATIVE
Nitrite: NEGATIVE
Protein, ur: NEGATIVE mg/dL
Specific Gravity, Urine: 1.03 (ref 1.005–1.030)
pH: 5 (ref 5.0–8.0)

## 2023-01-20 MED ORDER — DOCUSATE SODIUM 100 MG PO CAPS: 100.0000 mg | ORAL_CAPSULE | Freq: Two times a day (BID) | ORAL | 0 refills | Status: DC

## 2023-01-20 MED ORDER — HYDROCODONE-ACETAMINOPHEN 5-325 MG PO TABS
1.0000 | ORAL_TABLET | Freq: Once | ORAL | Status: AC
  Administered 2023-01-20: 1 via ORAL
  Filled 2023-01-20: qty 1

## 2023-01-20 NOTE — ED Provider Notes (Signed)
[place] EMERGENCY DEPARTMENT AT [facility] Provider Note   CSN: [id number] Arrival date & time: 01/20/23  1653     History  Chief Complaint  Patient presents with   Vaginal Prolapse         Sue Young is a 32 y.o. female with PMH as listed below who presents with possible vaginal prolapse. Reports that she had an episode of straining on the toilet for ~1h earlier today and when she finally had a bowel movement she felt a bulging and pain in her vaginal vault. No h/o similar. G2P2, uncomplicated SVDs. Had previously been in her NSOH. Sent a video to her RN friend who thought she might have a prolapse. Currently states she is in 7/10 pain/discomfort, feels like pressure in her pelvic region and pain her lower back. No urinary sxs, f/c, abdominal pain, vaginal bleeding/discharge, vaginal trauma.   Past Medical History:  Diagnosis Date   Anemia    Angio-edema    BV (bacterial vaginosis)    Chlamydia    Eczema    Gonorrhea    No pertinent past medical history    Recurrent upper respiratory infection (URI)    Thrombocytopenia (HCC)        Home Medications Prior to Admission medications   Medication Sig Start Date End Date Taking? Authorizing Provider  docusate sodium (COLACE) 100 MG capsule Take 1 capsule (100 mg total) by mouth every 12 (twelve) hours. 01/20/23  Yes Loetta Rough, MD  albuterol (VENTOLIN HFA) 108 (90 Base) MCG/ACT inhaler Inhale 1-2 puffs into the lungs every 6 (six) hours as needed for wheezing or shortness of breath. 12/07/22   Wynetta Fines, MD  cloNIDine HCl (KAPVAY) 0.1 MG TB12 ER tablet Take 0.1 mg by mouth 2 (two) times daily as needed (for hypertension).    [provider]  EPINEPHrine 0.3 mg/0.3 mL IJ SOAJ injection Inject 0.3 mg into the muscle as needed for anaphylaxis. 06/16/20   Marcelyn Bruins, MD  escitalopram (LEXAPRO) 10 MG tablet Take 1 tablet (10 mg total) by mouth daily. Patient not taking: Reported  on 12/07/2022 07/23/20 12/07/22  Lauro Franklin, MD  fluticasone [facility]) 50 MCG/ACT nasal spray 2 sprays each nostril daily for 1-2 weeks at a time before stopping once nasal congestion/drainage improves for maximum benefit. Patient not taking: Reported on 12/07/2022 06/16/20   Marcelyn Bruins, MD  Olopatadine HCl 0.2 % SOLN 1 drop each eye daily as needed for itchy/watery eyes. Patient taking differently: Place 1 drop into both eyes daily as needed (for seasonal allergies). 06/16/20   Marcelyn Bruins, MD  predniSONE (DELTASONE) 10 MG tablet Take 4 tablets (40 mg total) by mouth daily. 12/07/22   Wynetta Fines, MD  sertraline (ZOLOFT) 100 MG tablet Take 100 mg by mouth daily.    [provider]  TYLENOL PM EXTRA STRENGTH 500-25 MG TABS tablet Take 2 tablets by mouth at bedtime as needed (for sleep).    [provider]  valACYclovir (VALTREX) 500 MG tablet Take 500 mg by mouth daily.    [provider]      Allergies    Latex    Review of Systems   Review of Systems A 10 point review of systems was performed and is negative unless otherwise reported in HPI.  Physical Exam Updated Vital Signs BP (!) 158/105   Pulse 82   Temp 98.1 F (36.7 C) (Oral)   Resp 18   Ht 5\' 6"  (  1.676 m)   Wt 77.1 kg   LMP 01/15/2023 (Exact Date)   SpO2 98%   BMI 27.44 kg/m  Physical Exam General: Normal appearing female, lying in bed.  HEENT: Sclera anicteric, MMM, trachea midline.  Cardiology: RRR Resp: Normal respiratory rate and effort. Abd: Soft, non-tender, non-distended. No rebound tenderness or guarding.  Pelvic.  Performed with RN chaperone.  Unremarkable appearing external genitalia.  No bulging through the vaginal introitus.  On attempted speculum exam, patient with significant pressure in the vaginal vault and discomfort, procedure terminated per patient request. MSK: No peripheral edema or signs of trauma. Skin: warm, dry.  Back: No CVA  tenderness Neuro: A&Ox4, CNs II-XII grossly intact. MAEs.  Psych: Normal mood and affect.   ED Results / Procedures / Treatments   Labs (all labs ordered are listed, but only abnormal results are displayed) Labs Reviewed  URINALYSIS, ROUTINE W REFLEX MICROSCOPIC - Abnormal; Notable for the following components:      Result Value   APPearance HAZY (*)    All other components within normal limits    EKG None  Radiology No results found.  Procedures Procedures    Medications Ordered in ED Medications  HYDROcodone-acetaminophen (NORCO/VICODIN) 5-325 MG per tablet 1 tablet (1 tablet Oral Given 01/20/23 1820)    ED Course/ Medical Decision Making/ A&P                          Medical Decision Making Risk OTC drugs.    MDM:    Patient with unremarkable physical exam findings here however unable to perform pelvic exam with speculum due to increased vaginal pressure and discomfort on behalf of the patient.  Patient does show me a video that she took while she was on the toilet earlier which does demonstrate very clearly a pelvic organ prolapse.  She states this occurred while she was bearing down.  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  I encouraged her to follow-up within the next 1 to 2 weeks with her gynecologist.  Instructed that she will likely need pelvic floor PT And can do exercises at home.  Patient is advised to take MiraLAX 1-2 capfuls per day and is prescribed Colace 100 mg twice daily to help prevent constipation and such significant straining.  She is also instructed to stay well-hydrated.  Patient states she was trying her back in the gym and advised decrease in heavy lifting until she can see her gynecologist and obtain recommendations.  She does not feel as though she is retaining any urine and UA does not demonstrate UTI.  Patient will be discharged with discharge  instructions and return precautions, all questions answered to patient satisfaction.     Labs: UA ordered from triage is unremarkable.  Additional history obtained from chart review.  External records from outside source obtained and reviewed including OB/GYN  Reevaluation:  I reevaluated the patient and found that they have :stayed the same  Social Determinants of Health: Lives independently  Disposition:  Advised f/u with OB/Gyn within 1-2 weeks.  Patient will call them in the morning to make an appointment.  Co morbidities that complicate the patient evaluation  Past Medical History:  Diagnosis Date   Anemia    Angio-edema    BV (bacterial vaginosis)    Chlamydia    Eczema    Gonorrhea  No pertinent past medical history    Recurrent upper respiratory infection (URI)    Thrombocytopenia (HCC)      Medicines Meds ordered this encounter  Medications   HYDROcodone-acetaminophen (NORCO/VICODIN) 5-325 MG per tablet 1 tablet    Refill:  0   docusate sodium (COLACE) 100 MG capsule    Sig: Take 1 capsule (100 mg total) by mouth every 12 (twelve) hours.    Dispense:  60 capsule    Refill:  0    I have reviewed the patients home medicines and have made adjustments as needed  Problem List / ED Course: Problem List Items Addressed This Visit   None Visit Diagnoses       Female genital prolapse, unspecified type    -  Primary     Constipation, unspecified constipation type                       This note was created using dictation software, which may contain spelling or grammatical errors.    Loetta Rough, MD 01/21/23 [phone]

## 2023-01-20 NOTE — ED Triage Notes (Signed)
Pt presenting today for a possible prolapsed uterus. Pt states that she was trying to have a BM, was straining then felt something shift. Pt endorse feeling something hanging out of her vagina. Denies any bleeding at this time.

## 2023-01-20 NOTE — ED Provider Triage Note (Signed)
Emergency Medicine Provider Triage Evaluation Note  Sue Young , a 32 y.o. female  was evaluated in triage.  Pt complains of possible vaginal prolapse. Reports that she had large BM earlier and then had mass coming out of vagina. Reports she is G2P2. States she is in 7/10 pain.   Review of Systems  Positive:  Negative:   Physical Exam  BP (!) 156/94   Pulse (!) 106   Temp 98.4 F (36.9 C) (Oral)   Resp 18   Ht 5\' 6"  (1.676 m)   Wt 77.1 kg   LMP 01/15/2023 (Exact Date)   SpO2 94%   BMI 27.44 kg/m  Gen:   Awake, no distress   Resp:  Normal effort  MSK:   Moves extremities without difficulty  Other:    Medical Decision Making  Medically screening exam initiated at 6:18 PM.  Appropriate orders placed.  Kellea Potthoff was informed that the remainder of the evaluation will be completed by another provider, this initial triage assessment does not replace that evaluation, and the importance of remaining in the ED until their evaluation is complete.     Al Decant, PA-C 01/20/23 1819

## 2023-01-20 NOTE — Discharge Instructions (Addendum)
Thank you for coming to [facility] Emergency Department. You were seen for constipation and pressure in the vagina. We did an exam, labs, which showed likely a pelvic organ prolapse.   Please follow up with your OB/Gyn within the next 1-2 weeks.  Please avoid heavy lifting or straining to have a bowel movement in the meantime.  You can take 1-2 capfuls of MiraLAX per day and we have also prescribed Colace 100 mg twice per day for constipation.  Please stay well-hydrated and increase the fiber in your diet.  Do not hesitate to return to the ED or call 911 if you experience: -Worsening symptoms -Inability to have a bowel movement or urinate -Lightheadedness, passing out -Fevers/chills -Anything else that concerns you

## 2023-01-21 ENCOUNTER — Encounter (HOSPITAL_COMMUNITY): Payer: Self-pay

## 2023-01-21 ENCOUNTER — Emergency Department (HOSPITAL_COMMUNITY)
Admission: EM | Admit: 2023-01-21 | Discharge: 2023-01-21 | Disposition: A | Discharge status: Home / Self Care | Payer: Medicaid Other | Attending: Emergency Medicine | Admitting: Emergency Medicine

## 2023-01-21 DIAGNOSIS — N8110 Cystocele, unspecified: Secondary | ICD-10-CM

## 2023-01-21 DIAGNOSIS — Z91040 Latex allergy status: Secondary | ICD-10-CM | POA: Insufficient documentation

## 2023-01-21 DIAGNOSIS — N819 Female genital prolapse, unspecified: Secondary | ICD-10-CM | POA: Insufficient documentation

## 2023-01-21 MED ORDER — OXYCODONE-ACETAMINOPHEN 5-325 MG PO TABS: 1.0000 | ORAL_TABLET | Freq: Four times a day (QID) | ORAL | 0 refills | Status: DC | PRN

## 2023-01-21 NOTE — ED Triage Notes (Signed)
Pt arrives via POV. Pt was seen yesterday for a prolapsed bladder. Pt states pain has not improved. Pt AxOx4

## 2023-01-21 NOTE — ED Provider Notes (Signed)
[place] EMERGENCY DEPARTMENT AT [facility] Provider Note   CSN: [id number] Arrival date & time: 01/21/23  1241     History  Chief Complaint  Patient presents with   Vaginal Prolapse    Bladder    Sue Young is a 32 y.o. female here presenting with bladder prolapse.  Patient had 2 babies previously.  She states that yesterday she was straining to try to have a bowel movement and then she noticed that she had a prolapse.  Initially she thought she had a uterine prolapse.  She came to the ER and was seen and was noted to have a bladder prolapse.  Patient did not tolerate pelvic exam yesterday so the prolapse was unable to be reduced.  Patient states that she has persistent pelvic pain.  Patient states that she is able to urinate but she has pain when she tries to have a bowel movement.  Patient states that she tried to get an appointment with her GYN doctor and it will not be until January 13.  The history is provided by the patient.       Home Medications Prior to Admission medications   Medication Sig Start Date End Date Taking? Authorizing Provider  albuterol (VENTOLIN HFA) 108 (90 Base) MCG/ACT inhaler Inhale 1-2 puffs into the lungs every 6 (six) hours as needed for wheezing or shortness of breath. 12/07/22   Wynetta Fines, MD  cloNIDine HCl (KAPVAY) 0.1 MG TB12 ER tablet Take 0.1 mg by mouth 2 (two) times daily as needed (for hypertension).    [provider]  docusate sodium (COLACE) 100 MG capsule Take 1 capsule (100 mg total) by mouth every 12 (twelve) hours. 01/20/23   Loetta Rough, MD  EPINEPHrine 0.3 mg/0.3 mL IJ SOAJ injection Inject 0.3 mg into the muscle as needed for anaphylaxis. 06/16/20   Marcelyn Bruins, MD  escitalopram (LEXAPRO) 10 MG tablet Take 1 tablet (10 mg total) by mouth daily. Patient not taking: Reported on 12/07/2022 07/23/20 12/07/22  Lauro Franklin, MD  fluticasone [facility]) 50 MCG/ACT nasal spray 2  sprays each nostril daily for 1-2 weeks at a time before stopping once nasal congestion/drainage improves for maximum benefit. Patient not taking: Reported on 12/07/2022 06/16/20   Marcelyn Bruins, MD  Olopatadine HCl 0.2 % SOLN 1 drop each eye daily as needed for itchy/watery eyes. Patient taking differently: Place 1 drop into both eyes daily as needed (for seasonal allergies). 06/16/20   Marcelyn Bruins, MD  predniSONE (DELTASONE) 10 MG tablet Take 4 tablets (40 mg total) by mouth daily. 12/07/22   Wynetta Fines, MD  sertraline (ZOLOFT) 100 MG tablet Take 100 mg by mouth daily.    [provider]  TYLENOL PM EXTRA STRENGTH 500-25 MG TABS tablet Take 2 tablets by mouth at bedtime as needed (for sleep).    [provider]  valACYclovir (VALTREX) 500 MG tablet Take 500 mg by mouth daily.    [provider]      Allergies    Latex    Review of Systems   Review of Systems  Genitourinary:  Positive for pelvic pain.  All other systems reviewed and are negative.   Physical Exam Updated Vital Signs BP (!) 175/111   Pulse (!) 117   Temp 98.3 F (36.8 C) (Oral)   Resp 17   Ht 5\' 6"  (1.676 m)   Wt 77.1 kg   LMP 01/15/2023 (Exact Date)   SpO2 96%  BMI 27.44 kg/m  Physical Exam Vitals and nursing note reviewed.  Constitutional:      Comments: Uncomfortable  HENT:     Head: Normocephalic.     Nose: Nose normal.     Mouth/Throat:     Mouth: Mucous membranes are moist.  Eyes:     Extraocular Movements: Extraocular movements intact.     Pupils: Pupils are equal, round, and reactive to light.  Cardiovascular:     Rate and Rhythm: Normal rate and regular rhythm.     Pulses: Normal pulses.     Heart sounds: Normal heart sounds.  Pulmonary:     Effort: Pulmonary effort is normal.     Breath sounds: Normal breath sounds.  Abdominal:     General: Abdomen is flat.     Palpations: Abdomen is soft.  Genitourinary:    Comments:  Deferred Musculoskeletal:     Cervical back: Normal range of motion and neck supple.  Skin:    General: Skin is warm.     Capillary Refill: Capillary refill takes less than 2 seconds.  Neurological:     General: No focal deficit present.     Mental Status: She is alert and oriented to person, place, and time.  Psychiatric:        Mood and Affect: Mood normal.        Behavior: Behavior normal.     ED Results / Procedures / Treatments   Labs (all labs ordered are listed, but only abnormal results are displayed) Labs Reviewed - No data to display  EKG None  Radiology No results found.  Procedures Procedures    Medications Ordered in ED Medications - No data to display  ED Course/ Medical Decision Making/ A&P                                 Medical Decision Making Sue Young is a 32 y.o. female here presenting with possible bladder prolapse.  I have reviewed her notes from yesterday.  Pelvic exam was done and she had a bladder prolapse and she did not tolerate a pelvic exam.  I offered her another pelvic exam after pain medicine to try and reduce the bladder prolapse.  I also discussed with her that unfortunately with bladder prolapse she may continue to happen despite attempted reduction.  I told her that she needs to see GYN doctor and be fitted for a pessary.  She states that she would rather just take pain medicine and follow-up with GYN and not try another pelvic exam.  I gave her strict return precautions.   Problems Addressed: Female bladder prolapse: acute illness or injury    Final Clinical Impression(s) / ED Diagnoses Final diagnoses:  None    Rx / DC Orders ED Discharge Orders     None         Charlynne Pander, MD 01/21/23 1334

## 2023-01-21 NOTE — Discharge Instructions (Signed)
As we discussed, you have bladder prolapse  Please take Tylenol or Motrin for pain and Percocet for severe pain.  [facility]  Return to ER if you have severe pelvic pain, unable to urinate

## 2023-01-22 NOTE — ED Notes (Signed)
 Discharge instructions, follow up care instructions, and return to ED precautions discussed with Pt. Pt verbalized understanding.  AVS given to Pt. NAD noted at time of discharge.     Lonni Dorise Norfolk, RN 01/22/23 1745

## 2023-01-22 NOTE — ED Notes (Signed)
 Patient's medication, valium 5mg , was given orally. Provider aware.   Lauralee Goodrich Page, EMT-P 01/22/23 [phone]

## 2023-01-22 NOTE — ED Provider Notes (Signed)
 Emergency Department Provider Note  Dragon voice dictation used for charting.    Provider at bedside: 11:08 AM  History obtained from the: Patient  History   Chief Complaint  Patient presents with   Urinary Problem     HPI  Sue Young is a 32 y.o. female who presents to the ED with multiple complaints.  The patient states that she has been to the ED twice due to a likely bladder prolapse.  She has been able to make an appointment with her OB/GYN, but will not be seen until January 13.  She notes that she was prescribed narcotic pain medicine and Colace, but has not been taking MiraLAX and notes that she has not had a bowel movement in 3 days.  She is still passing gas.  She also endorses difficulty urinating and states that she was not able to urinate this morning even though she feels the urge to do so.  Additionally, she endorses lower abdominal pain/pressure.  Patient denies any fevers.     No LMP recorded.   Past Medical History Past Medical History:  Diagnosis Date   Acne    Anemia    Dermatitis    Eczema    Iron deficiency     Past Surgical History Past Surgical History:  Procedure Laterality Date   OTHER SURGICAL HISTORY     Procedure: OTHER SURGICAL HISTORY (wisdom teeth pulled)      Allergies Allergies  Allergen Reactions   Latex Rash     Family History Family History  Problem Relation Name Age of Onset   Diabetes Mother     Hypertension Father     Eczema Brother     Psoriasis Neg Hx       Social History Social History   Tobacco Use   Smoking status: Never   Smokeless tobacco: Never  Vaping Use   Vaping status: Never Used  Substance Use Topics   Alcohol use: Never   Drug use: Never      Physical Exam   Vitals:   01/22/23 0858 01/22/23 1201 01/22/23 1313 01/22/23 1719  BP: (!) 163/100  (!) 165/103 150/90  BP Location: Right arm   Right arm  Patient Position: Sitting   Lying  Pulse: 100  85 104  Resp:  20 16 18 18   Temp: 98 F (36.7 C)     TempSrc: Oral     SpO2: 97%   100%  Weight: 77.1 kg (170 lb)     Height: 167.6 cm (5' 6)       Physical Exam Vitals and nursing note reviewed.  Constitutional:      General: She is not in acute distress.    Appearance: She is well-developed. She is not toxic-appearing.  HENT:     Head: Normocephalic and atraumatic.     Right Ear: External ear normal.     Left Ear: External ear normal.     Mouth/Throat:     Mouth: Mucous membranes are moist.     Pharynx: Oropharynx is clear.  Eyes:     General: Lids are normal.     Conjunctiva/sclera: Conjunctivae normal.  Cardiovascular:     Rate and Rhythm: Normal rate and regular rhythm.     Pulses: Normal pulses.     Heart sounds: Normal heart sounds.  Pulmonary:     Effort: Pulmonary effort is normal.     Breath sounds: Normal breath sounds.  Abdominal:     General: Abdomen is flat. Bowel  sounds are normal.     Palpations: Abdomen is soft.     Tenderness: There is abdominal tenderness (difure lower ttp).  Genitourinary:    Comments: On external exam, no evidence of prolapse.  Pt defers speculum exam due to pain Neurological:     Mental Status: She is alert.  Psychiatric:        Mood and Affect: Mood normal.        Behavior: Behavior normal.     Labs   Lab Results (last 24 hours)     ** No results found for the last 24 hours. **         Radiology   Radiology Results (last 72 hours)     Procedure Component Value Units Date/Time   CT Abdomen Pelvis W Contrast [[id number]] Collected: 01/22/23 1308   Order Status: Completed Updated: 01/22/23 1316   Narrative:     CLINICAL DATA:  Lower abdominal pain  EXAM: CT ABDOMEN AND PELVIS WITH CONTRAST  TECHNIQUE: Multidetector CT imaging of the abdomen and pelvis was performed using the standard protocol following bolus administration of intravenous contrast.  RADIATION DOSE REDUCTION: This exam was performed according to  the departmental dose-optimization program which includes automated exposure control, adjustment of the mA and/or kV according to patient size and/or use of iterative reconstruction technique.  CONTRAST:  80 mL iohexoL  (OMNIPAQUE ) 350 mg iodine/mL injection (MDV) 80 mL  COMPARISON:  CT abdomen pelvis 03/08/2018  FINDINGS: Lower chest: Normal heart size. Lung bases are clear. No pleural effusion.  Hepatobiliary: Liver is normal in size and contour. No focal hepatic lesion. Gallbladder is unremarkable. No intrahepatic or extrahepatic biliary ductal dilatation.  Pancreas: Unremarkable  Spleen: Unremarkable  Adrenals/Urinary Tract: Normal adrenal glands. Symmetric renal enhancement. 1.8 cm cyst midpole right kidney. No imaging follow-up needed. Urinary bladder is unremarkable.  Stomach/Bowel: No abnormal bowel wall thickening or evidence for bowel obstruction. No free intraperitoneal air. Normal appendix.  Vascular/Lymphatic: Normal caliber abdominal aorta. Retroaortic left renal vein. No retroperitoneal lymphadenopathy.  Reproductive: Uterus is unremarkable. Probable left ovarian corpus luteum. Small amount of free fluid in the pelvis.  Other: None  Musculoskeletal: No acute or significant osseous findings.    Impression:     1. No acute process within the abdomen or pelvis. 2. Probable left ovarian corpus luteum. Small amount of free fluid in the pelvis.   Electronically Signed   By: Bard Moats M.D.   On: 01/22/2023 13:14         EKG     No results found for this visit on 01/22/23.   ED Course   ED Course as of 01/24/23 1859  Sat Jan 22, 2023  1129 CBC, MP evaluated for leukocytosis, leukopenia, anemia, thrombocytopenia, hyponatremia, hypernatremia, hypokalemia, acidosis, acute kidney injury, hyperglycemia, hypoglycemia. Urinalysis ordered to evaluate for infection, hematuria, proteinuria, dehydration, glucosuria  [AS]  1440 Patient discussed with  OB/GYN Dr. Hedwig.  To help with discomfort from speculum exam, she does recommend vaginal Valium.  I discussed with the patient and she was amenable to trying the medication and later, speculum exam [AS]  1532 Valium was ordered to be given vaginally, but unfortunately this medication was given orally instead.  Paramedic Emmett informing of his mistake and I discussed with the patient.  She is still amenable to trying a speculum exam [AS]    ED Course User Index [AS] Peyton PARAS Schlagheck, PA-C    Procedure Note   Procedures  Medical Decision Making   Clinical Complexity  Patient's presentation is most consistent with exacerbation of chronic illness.    Provider time spent in patient care today, inclusive of but not limited to clinical reassessment, review of diagnostic studies, and discharge preparation, was greater than 30 minutes.    Medical Decision Making Problems Addressed: Pelvic pain: complicated acute illness or injury  Amount and/or Complexity of Data Reviewed Labs: ordered. Decision-making details documented in ED Course. Radiology: ordered and independent interpretation performed. Decision-making details documented in ED Course. Discussion of management or test interpretation with external provider(s): Dr. Hedwig  Risk OTC drugs. Prescription drug management.     Differential diagnosis includes but is not limited to DDX: STD, urinary tract infection, endometriosis, musculoskeletal pain, uterine fibroid, ovarian cyst, pelvic prolapse,   ED Clinical Impression   1. Pelvic pain      ED Assessment/Plan  Mrs. Lavon presented to the ED with multiple complaints.  Upon arrival to patient, she was resting on the hospital bed in no acute distress.  Physical exam was markable for the above findings.  While in the ED, patient was given Zofran  for nausea, morphine  for pain and Colace and MiraLAX for constipation. CBC and CMP were unremarkable.  UA was not consistent  with a UTI but did contain elevated LE and WBCs.  Urine pregnancy test was negative.  Wet prep showed many clue cells, WBCs, but no clue cells, trichomonas, or yeast.  Serum hCG was not elevated.  CT of the abdomen pelvis did not show any acute findings to explain the patient's symptoms.  Per my viewing, I did not note any free air in the abdomen. I discussed the patient with OB/GYN Dr. Hedwig who recommended vaginal Valium to assist in muscle relaxation for a speculum exam.  Unfortunately, the paramedic gave the patient the Valium by mouth.  We apologize for the mistake and upon reevaluation, the patient states that the oral Valium was very beneficial in helping her pelvic discomfort.  Speculum exam was performed at that time.  I discussed the findings with Dr. Hedwig and am very appreciative for her advice concerning the care of Ms. Hartgrove.  Ultimately, she felt the patient was safe for outpatient follow-up Strict return precautions were discussed and the patient was given the rest of her discharge instructions.  She stated that she understood and had no further questions.  Patient was discharged in stable condition.  ED Meds Given During Visit Medications  ondansetron  (ZOFRAN ) injection 4 mg (4 mg intravenous Given 01/22/23 1201)  morphine  injection 4 mg (4 mg intravenous Given 01/22/23 1201)  sodium chloride  (bolus) 0.9 % bolus 500 mL (0 mL intravenous Stopped 01/22/23 1302)  docusate sodium  (COLACE) capsule 100 mg (100 mg oral Given 01/22/23 1201)  polyethylene glycol (GLYCOLAX) packet 17 g (17 g oral Given 01/22/23 1201)  iohexoL  (OMNIPAQUE ) 350 mg iodine/mL injection (MDV) 80 mL (80 mL intravenous Given 01/22/23 1231)  diazePAM (VALIUM) tablet 5 mg (5 mg vaginal Given 01/22/23 1506)  lidocaine  (XYLOCAINE ) 2 % viscous solution 10 mL (10 mL topical Given 01/22/23 1552)      Discharge Medication List as of 01/22/2023  5:11 PM        FOLLOW UP Tari ONEIDA Banter, PA-C [address] [place]  [address] [place] [address] [phone]   As needed  [facility] -  EMERGENCY DEPARTMENT [address] [place]  [address] [phone]  As needed  [facility] Uro[facility]necology and Pelvic Surgery [phone] [facility]  [address] [place]  [phone]  As needed for further evaluation of your possible uterine/bladder prolapse  Darryle Devonna Pun, MD [address] [place] [address] [phone]   Someone from the clinic should be reaching out to you to schedule an appointment for further evaluation.  If you have not heard anything by Tuesday morning, please call for assistance in scheduling an appointment   Electronically signed by: 11:08 AM 01/24/2023 for [facility] Schlagheck PA-C

## 2023-01-27 LAB — OB RESULTS CONSOLE GBS: GBS: NEGATIVE

## 2023-01-27 NOTE — Progress Notes (Signed)
 [facility]  Female Pelvic Medicine & Reconstructive Surgery Initial Visit  Today I had the great pleasure of meeting Sue Young who is a 33 y.o. female seen in consultation for a chief complaint of prolapse.    She was seen in the ER twice for pain that was attributed to constipation and prolapse and is here for follow-up.  I have reviewed all accompanying medical records.  Her primary concerns are prolapse and her goals for treatment are to hear about options.  Urinary Symptoms: Not bothered by frequency. Rare SUI.  She has had nocturia, getting up 2-3 times per night to void.   She denies enuresis.    She denies pain or burning with urination, urinary hesitancy, urinary retention, hematuria, pyleonephritis or kidney stones.   States she has had chronic UTI's  Bladder emptying:normal Pushing on a bulge in order to empty her bladder: no  Pelvic Organ Prolapse Symptoms:                  She has a feeling of a bulge the vaginal area. She is seeing a bulge. This bulge is bothersome at times.  Bowel Symptoms: Variable BM's. Has had to strain and splint at times because she is worried the bulge will come out.  Menstrual History: Regular; s/p Nexplanon removal last year  Past Medical History:  Patient  has a past medical history of Acne, Anemia, Dermatitis, Eczema, and Iron deficiency.    Past Surgical History: She  has a past surgical history that includes Other surgical history.   Past OB/GYN History: 2 NSVD's She is sexually active.  She has had recent deep dyspareunia    Medications: She has a current medication list which includes the following prescription(s): albuterol  hfa, buspirone, clonidine hcl, docusate sodium , epinephrine , oxycodone -acetaminophen , sertraline, and valacyclovir.   Allergies: Patient is allergic to latex.   Social History: Patient  reports that she has never smoked. She has never used smokeless tobacco. She  reports that she does not drink alcohol and does not use drugs.   Family History: family history includes Diabetes in her mother; Eczema in her brother; Hypertension in her father.   Review of Systems:  Constitutional:  Patient denies any unintentional weight loss or change in strength Integumentary:  Patient denies any rashes or pruritus Eyes:  Patient denies double vision or eye pain Ears/Nose/Mouth/Throat:  Patient denies any nosebleeds or gum bleeding Cardiovascular:  Patient denies chest pain or syncope Respiratory: Patient denies hemoptysis  Gastrointestinal:  Patient denies nausea, vomiting, constipation, or diarrhea Musculoskeletal:  Patient denies muscle cramps or weakness Neurologic: Patient denies convulsions or seizures Psychiatric: Patient denies hallucinations or suicidal ideations Alleric/Immunologic: Patient denies food allergies Hematologic/Lymphatic: Patient denies bleeding tendencies Endocrine:  Patient denies heat/cold intolerance     OBJECTIVE    Physical Exam: BP (!) 143/97   Pulse 72   Temp 98.9 F (37.2 C) (Temporal)   SpO2 99%   Constitutional:  General appearance: Well nourished, well developed female in no acute distress.  Psych:  Normal mood and affect, alert and oriented x 3 Neck:  normal appearance.  Respiratory:  Normal respiratory effort.  Cardiovascular:  No lower extremity edema.  Skin:  Warm and dry, no lesions. Abdomen:  No masses.  No abdominal tenderness.  No hernias. No hepatosplenomegaly. No guarding or rebound. Surgical scars none  Detailed Urogynecologic Evaluation:  Pelvic Exam:  External female genitalia is normal; Bartholin's and Skene's glands normal in appearance. Urethral meatus: normal  in appearance with no prolapse or caruncle, no urethral masses or discharge.  - urethral hypermobility.   There is no stool contamination of the perineum noted.   Speculum exam:  Normal vaginal epithelium. no atrophy.  Cervix biopsy scar  noted.  Uterus normal single, nontender.  Adnexa normal adnexa.    Pelvic floor strength I/V  Mild high-tone bilaterally with pain to palpation  POP-Q EXAM  Aa -2  Ba -2  Ap -1  Bp -2  C -5  D -7  Gh 4  Pb 3  TVL 9  Staging   Stage 2 UVP   Rectal Exam:  Anal verge looks normal with no hemorrhoids, rectal prolapse, or dovetail sign present.   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 However, this device would not 'correct' the prolapse.  PFPT -Surgical options are the only way to permanently correct vaginal prolapse. Not an option due to desire for future childbearing  Discussed bowel regimen to increase stool bulk and decrease need for splinting; also constipation likely major cause of her recent pain and ED visits  PFPT referral placed  Discussed that if she develops ODS, would be amenable to isolated posterior repair to help with this but that combination of PFPT and bowel regimen should prevent this  PFPT would help with her mild HTPFD as well  F/u 6 months   [facility] [place], MD  I spent 60 minutes with patient; more than 50% of the time was spent counseling patient and arranging treatment.

## 2023-02-01 NOTE — Progress Notes (Signed)
 Care Management Case Closure Note  Situation: Member not enrolled in care navigation services   Background:Member identified in [facility] 01/24/2023 with High Risk ED visit 01/22/2023 at [facility] with Pelvic Pain. Review of EMR shows member had ED visit at [facility] 01/20/2023 with female genital prolapse. ED visit [facility] 01/21/2023 with Female Bladder prolapse.    Assessment:  ACN outreached member for ED week 2 follow up call. Member reported she did follow up with both her PCP and urology. Member reported urology reassured her she would be fine and no surgery would be not be an option at this time since she does want to have more kids. Member reported it was recommended she start Pelvic PT and has her first appointment 03/23/2023. Member reported she will follow up with urology 07/26/2023. Member reported she is doing better and feels good with treatment plan and has no further needs or concerns at this time and will call ACN if she needs anything. Program closed. No further needs.  .  Recommendation: If further needs arise, new referral can be sent to [facility] for Care Management services. Ambulatory Care Navigator sent letter via member's [facility] Patient Portal  01/25/2024 with ACN's contact information.  Kate Star RN, BSN, [facility] [place], [place] [address] Office number [phone]

## 2023-03-04 ENCOUNTER — Ambulatory Visit: Payer: Medicaid Other | Admitting: Obstetrics and Gynecology

## 2023-04-22 NOTE — Progress Notes (Signed)
 "  [facility]  - Family Medicine Sue Young  Date of Service: 05/02/2023 Patient Name: Sue Young Patient DOB: 02-28-1990   Subjective:   Patient comes in for Anxiety, Depression, and Hypertension  Elevated BP Sue Young is here for elevated BP follow up.   Last hypertension follow-up 3 months ago .   Patient does exercising regularly. Patient watches sodium intake. Home blood pressuremonitoring:  No Patient denies chest pain, exertional chest pressure/discomfort, lower extremity edema and palpitations.   ANXIETY/DEPRESSION FOLLOW UP: STATUS:Since last visit sxs are: Pretty much nonexistent. Pt is not taking any anxiety/depression meds anymore. ASSOCIATED SYMPTOMS:  appetite change: No change in sleep pattern: No change in energy level: No feelings of sadness: No anxiety:No homicidal ideations: No suicidal ideations: No treatment: currently taking meds as directed? No seeing therapist? No any alcohol or drug use: No  Today's PHQ-2 results: Patient Health Questionnaire-2 Score: 0 (05/02/23 0816) Today's PHQ-9 result:   PHQ-9 Question # 9   Interpretation: PHQ-2 Interpretation: Negative (None-minimal Depression Severity) (05/02/23 0816)   Depression Plan: Normal/Negative Screening   BP Readings from Last 3 Encounters:  05/02/23 118/72  01/28/23 134/88  01/27/23 (!) 143/97                        Wt Readings from Last 3 Encounters:  05/02/23 79.6 kg (175 lb 6.4 oz)  01/28/23 82.4 kg (181 lb 9.6 oz)  01/22/23 77.1 kg (170 lb)      Lab Results  Component Value Date   CHOL 186 11/12/2022   TRIG 78 11/12/2022   HDL 74 11/12/2022   LDLCALC 95 11/12/2022    Hypertension treatment goalsreviewed with the patient:  LDL cholesterol <100 and blood pressure <140/90. If not at goal, the following barriers to care were identified: none.  Social History   Tobacco Use   Smoking status: Never   Smokeless tobacco: Never  Vaping Use    Vaping status: Never Used  Substance Use Topics   Alcohol use: Never   Drug use: Never    Review of Systems  Respiratory:  Negative for cough and shortness of breath.   Cardiovascular:  Negative for chest pain, palpitations and leg swelling.  Gastrointestinal:  Negative for abdominal pain.  Musculoskeletal:  Negative for back pain.  Neurological:  Negative for headaches.     The following portions of the patient's history were reviewed and updated as appropriate: allergies, currentmedications, past family history, past medical history, past surgical history and problem list.   Objective:   Vital Signs BP 118/72 (BP Location: Left arm, Patient Position: Sitting)   Pulse 103   Temp 97.7 F (36.5 C) (Temporal)   Ht 1.676 m (5' 6)   Wt 79.6 kg (175 lb 6.4 oz)   LMP 04/22/2023 (Exact Date)   SpO2 99%   BMI 28.31 kg/m   Constitutional.  Well appearing 33 y.o. female, well developed, well nourished, no acutedistress. Respiratory.  Clear bilaterally, breath sounds equal, respirations unlabored. Cardiovascular.  Regular, nl S1, S2; no murmurs, gallops or rubs.  No lower extremity edema, 2+ peripheral pulses.  Assessment/Plan:   [name] was seen today for anxiety, depression and hypertension.  Diagnoses and all orders for this visit:  Elevated BP without diagnosis of hypertension  History of episode of anxiety  History of episode of depression    -BP at goal. Continue with diet and exercise modifications -Anxiety and Depression resolved. Continue to f/u with psych for  ADHD   - Diet low in refined carbohydrates (bread/rice/pasta/potatoes) and sugars (sweets, sugary beverages) as well as unhealthy fats (fried foods, many of the processed/prepackaged snack foods) and regular exercise recommended. - Goal blood pressure ideally <130/80 - Follow up in 6 months for CPE  The patient verbalizes understanding and in agreement with the above plan. All questions  answered.  Current Outpatient Medications  Medication Sig Dispense Refill   albuterol  HFA (PROVENTIL  HFA;VENTOLIN  HFA;PROAIR  HFA) 90 mcg/actuation inhaler Inhale 1-2 puffs.     atoMOXetine (STRATTERA) 80 mg capsule TAKE 1 CAPSULE IN THE MORNING AFTER 40MG  ARE COMPLETE     EPINEPHrine  (EPIPEN ) 0.3 mg/0.3 mL injection syringe Inject 0.3 mg into the thigh.     valACYclovir (VALTREX) 500 mg tablet Take 1 tablet (500 mg total) by mouth daily. 90 tablet 1   No current facility-administered medications for this visit.    Medication side effects discussed with patient. Advised patient to call clinic or return forvisit if these symptoms occur.   Goals of care discussed with patient including med compliance and adequate follow up.  Return in about 6 months (around 11/01/2023) for CPE,.    This document was created using the aid of voicerecognition Scientist, clinical (histocompatibility and immunogenetics).   Tari ONEIDA Banter, PA-C  This document serves as a record of services personally performed by Tari Banter PA-C.  It was created on their behalf by Michae JONETTA Kerns, CMA, a trained medical scribe, and Certified Medical Assistant (CMA). During the course of documenting the history, physical exam and medical decision making, I was functioning as a stage manager. The creation of this record is the providers dictation and/or activities during the visit.  Electronically signed by Michae JONETTA Kerns, CMA 03/28/2023 11:49 AM     This document serves as a record of services personally performed by Tari Banter PA-C.  It was created on their behalf by Delon Crooked, CMA, a trained medical scribe, and Certified Medical Assistant (CMA). During the course of documenting the history, physical exam and medical decision making, I was functioning as a stage manager. The creation of this record is the providers dictation and/or activities during the visit.  Electronically signed by Delon Crooked, CMA 05/02/2023 8:18 AM  I  agree the documentation is accurate and complete.  Electronically signed by: Tari ONEIDA Banter, PA-C 05/02/2023 8:36 AM    "

## 2023-07-13 LAB — OB RESULTS CONSOLE GC/CHLAMYDIA
Chlamydia: NEGATIVE
Neisseria Gonorrhea: NEGATIVE

## 2023-07-13 LAB — OB RESULTS CONSOLE HIV ANTIBODY (ROUTINE TESTING): HIV: NONREACTIVE

## 2023-07-13 LAB — OB RESULTS CONSOLE VARICELLA ZOSTER ANTIBODY, IGG: Varicella: IMMUNE

## 2023-07-13 LAB — OB RESULTS CONSOLE HEPATITIS B SURFACE ANTIGEN: Hepatitis B Surface Ag: NEGATIVE

## 2023-07-13 LAB — OB RESULTS CONSOLE RPR: RPR: NONREACTIVE

## 2023-07-13 LAB — HEPATITIS C ANTIBODY: HCV Ab: NEGATIVE

## 2023-07-13 LAB — OB RESULTS CONSOLE RUBELLA ANTIBODY, IGM: Rubella: IMMUNE

## 2023-11-01 NOTE — Telephone Encounter (Signed)
 Tried calling patient to reschedule CPE due to it being scheduled to soon. Number is no longer in service and a mychart message was sent.

## 2023-12-07 LAB — OB RESULTS CONSOLE RPR: RPR: NONREACTIVE

## 2023-12-29 NOTE — Telephone Encounter (Signed)
 I spoke with patient and scheduled her MFM consult virtual visit.   Patient appointment information has been faxed to the referring office.

## 2024-01-13 NOTE — Progress Notes (Signed)
 MATERNAL FETAL MEDICINE CONSULT  Requesting Provider Name:   [facility] Stacia H[place], [place]  Date of Service: Fri 01/13/2024  Reason for Consultation: thrombocytopenia in pregnancy  Consulting Physician: Eleanor Everts, MD  Location Information: Patient State (at time of visit): [place]  Patient Location (at time of visit): Home  Provider Location: Hospital/Provider-Based Clinic Is provider licensed to provide clinical care in the current location/state of the patient? Yes   Consent:  Patient's identity was confirmed. Presenting condition or illness was discussed with the patient/personal representative. Current proposed treatment for presenting condition or illness was explained to patient/personal representative. The patient/personal representative verbally authorized treatment to be provided by audio/video, which may include a limited review of patient's current health status, medication, or other treatment recommendations, patient education, and an opportunity to ask questions about condition and treatment. Verbal Consent Granted by Patient/Personal Representative:Yes   Visit Information: Modality: 2-Way Real-Time Audio/Video  Video Total Time: 17 minutes  Sue Young is a 33 year old G3P2002 at 34 weeks, 2 days by LMP consistent with an 8 week CRL who presents as a referral secondary to thrombocytopenia. Pregnancy is complicated by history of thrombocytopenia in previous pregnancies. Platelet count November of 2025 was 73K. Patient reports most recently was back up to 150. She has had low risk cell-free DNA screening this pregnancy (Panorama, female) and negative carrier screening (Horizon 4).   She reports a history of pelvic prolapse and has previously been followed by pelvic PT. She denies any other medical conditions. Her OB history is as follows:   G1 - 2013, SVD, healthy female at 38 weeks, 6 pounds G2 - 2016, SVD, healthy female at 40 weeks, 6 pounds 9oz   G3 - current   She states she has never needed to take steroids to increase platelet level in prior pregnancies and has not needed this pregnancy. Reports normal platelet count outside of pregnancy. Does report history of iron deficiency anemia at times. She elected not to get an epidural for previous deliveries but states she was eligible to get one. Plans to have unmedicated birth this pregnancy at present time. She denies any history of preeclampsia.   Today, we discussed the current diagnosis is most consistent with gestational thrombocytopenia (mildly low platelet count in third trimester with resolution at 6 weeks PP, with nadir rarely < 75,000) given the normal platelet count in first trimester and mild low platelet count in third trimester without other suspected disorder such as preeclampsia or SLE. While her platelet count was 73K at 28 weeks, based on the subsequent rise without treatment, I do feel her diagnosis is most consistent with gestational thrombocytopenia. In most cases, mild gestational thrombocytopenia carries a benign prognosis in pregnancy. I had a discussion with her about the etiology, differential diagnosis, and management of thrombocytopenia.    We discussed management of thrombocytopenia in pregnancy:  Routine obstetric management is appropriate.  Epidural anesthesia is considered to be appropriate in women with platelet counts >80,000 that has been stable.     Recommendations include: Follow serial platelet counts:  ~ every 4 weeks until 36 weeks, then weekly.  If platelet count <100,000 recommend anesthesia consultation to discuss options regional anesthesia.   If platelet count continue to decrease, consider alternative diagnosis (ITP) and re-consult. Treatment with prednisone  may be indicated around the time of delivery - would consider if platelet count less than 100K beyond 36 weeks. Cesarean delivery is reserved for standard OB indications.    Recommend CMP with next  platelet count to assure normal liver and kidney function.   Thank you for the opportunity to be involved in the care of this patient. Please contact our office if we can be of further assistance.   I spent 30 minutes working on this case, reviewing medical records and imaging studies, interviewing the patient, discussing the diagnosis, formulating plan of care, and in face-to-face audio and video interaction with patient. Eleanor FREDRIK Everts, MD Maternal Fetal Medicine Attending [facility]

## 2024-01-26 NOTE — L&D Delivery Note (Signed)
 Delivery Note Labor onset: 02/20/2024 Labor Onset Time: 1557 Complete dilation at  1708 Onset of pushing at 1708 FHR second stage Cat 1 Analgesia/Anesthesia intrapartum: none  Voluntary pushing with maternal urge. Delivery of a viable female at 48. Fetal head delivered in LOA position.  Nuchal cord: none.  Infant placed on maternal abd, dried, and tactile stim.  Cord double clamped after 4 min and cut by Skylar, pt's daughter.  RN x2 present for birth.  Cord blood sample collected: Yes Placenta delivered Schultz side, intact, with 3 VC.  Placenta to L&D  Uterine tone firm, bleeding moderate w/ clot x1  1st degree laceration identified.  Site hemostatic and does not require suture QBL (mL): 539 Complications: none APGAR: APGAR (1 MIN): 8  APGAR (5 MINS): 9  APGAR (10 MINS):   Mom to [facility].  Baby to Couplet care / Skin to Skin. Circumcision yes  Yazmeen Woolf B Cloteal Isaacson DNP, CNM 02/20/2024, 6:12 PM

## 2024-02-20 ENCOUNTER — Encounter (HOSPITAL_COMMUNITY): Payer: Self-pay | Admitting: Obstetrics and Gynecology

## 2024-02-20 ENCOUNTER — Other Ambulatory Visit: Payer: Self-pay

## 2024-02-20 ENCOUNTER — Inpatient Hospital Stay (HOSPITAL_COMMUNITY)
Admission: AD | Admit: 2024-02-20 | Discharge: 2024-02-23 | Disposition: A | Discharge status: Home / Self Care | Attending: Obstetrics and Gynecology | Admitting: Obstetrics and Gynecology

## 2024-02-20 DIAGNOSIS — O1413 Severe pre-eclampsia, third trimester: Principal | ICD-10-CM | POA: Diagnosis present

## 2024-02-20 DIAGNOSIS — D696 Thrombocytopenia, unspecified: Secondary | ICD-10-CM | POA: Diagnosis present

## 2024-02-20 DIAGNOSIS — Z91410 Personal history of adult physical and sexual abuse: Secondary | ICD-10-CM

## 2024-02-20 LAB — COMPREHENSIVE METABOLIC PANEL WITH GFR
ALT: 19 U/L (ref 0–44)
AST: 27 U/L (ref 15–41)
Albumin: 3.9 g/dL (ref 3.5–5.0)
Alkaline Phosphatase: 136 U/L — ABNORMAL HIGH (ref 38–126)
Anion gap: 13 (ref 5–15)
BUN: 7 mg/dL (ref 6–20)
CO2: 21 mmol/L — ABNORMAL LOW (ref 22–32)
Calcium: 9.4 mg/dL (ref 8.9–10.3)
Chloride: 101 mmol/L (ref 98–111)
Creatinine, Ser: 0.74 mg/dL (ref 0.44–1.00)
GFR, Estimated: >60 mL/min
Glucose, Bld: 76 mg/dL (ref 70–99)
Potassium: 4.3 mmol/L (ref 3.5–5.1)
Sodium: 136 mmol/L (ref 135–145)
Total Bilirubin: 0.3 mg/dL (ref 0.0–1.2)
Total Protein: 7.5 g/dL (ref 6.5–8.1)

## 2024-02-20 LAB — CBC
HCT: 39 % (ref 36.0–46.0)
Hemoglobin: 13.4 g/dL (ref 12.0–15.0)
MCH: 28.1 pg (ref 26.0–34.0)
MCHC: 34.4 g/dL (ref 30.0–36.0)
MCV: 81.8 fL (ref 80.0–100.0)
Platelets: 84 10*3/uL — ABNORMAL LOW (ref 150–400)
RBC: 4.77 MIL/uL (ref 3.87–5.11)
RDW: 14.5 % (ref 11.5–15.5)
WBC: 11.7 10*3/uL — ABNORMAL HIGH (ref 4.0–10.5)
nRBC: 0 % (ref 0.0–0.2)

## 2024-02-20 LAB — PROTEIN / CREATININE RATIO, URINE
Creatinine, Urine: 71 mg/dL
Protein Creatinine Ratio: 0.4 mg/mg — ABNORMAL HIGH
Total Protein, Urine: 25 mg/dL

## 2024-02-20 LAB — TYPE AND SCREEN
ABO/RH(D): B POS
Antibody Screen: NEGATIVE

## 2024-02-20 LAB — SYPHILIS: RPR W/REFLEX TO RPR TITER AND TREPONEMAL ANTIBODIES, TRADITIONAL SCREENING AND DIAGNOSIS ALGORITHM: RPR Ser Ql: NONREACTIVE

## 2024-02-20 MED ORDER — BENZOCAINE-MENTHOL 20-0.5 % EX AERO
1.0000 | INHALATION_SPRAY | CUTANEOUS | Status: DC | PRN
  Filled 2024-02-20: qty 56

## 2024-02-20 MED ORDER — LABETALOL HCL 5 MG/ML IV SOLN: 20.0000 mg | INTRAVENOUS | Status: DC | PRN

## 2024-02-20 MED ORDER — ACETAMINOPHEN 325 MG PO TABS: 650.0000 mg | ORAL_TABLET | ORAL | Status: DC | PRN

## 2024-02-20 MED ORDER — HYDRALAZINE HCL 20 MG/ML IJ SOLN: 10.0000 mg | INTRAMUSCULAR | Status: DC | PRN

## 2024-02-20 MED ORDER — PENICILLIN G POT IN DEXTROSE 60000 UNIT/ML IV SOLN: 3.0000 10*6.[IU] | INTRAVENOUS | Status: DC

## 2024-02-20 MED ORDER — OXYTOCIN-SODIUM CHLORIDE 30-0.9 UT/500ML-% IV SOLN
2.5000 [IU]/h | INTRAVENOUS | Status: DC
  Filled 2024-02-20: qty 500

## 2024-02-20 MED ORDER — COCONUT OIL OIL
1.0000 | TOPICAL_OIL | Status: DC | PRN
  Administered 2024-02-22: 1 via TOPICAL

## 2024-02-20 MED ORDER — EPHEDRINE 5 MG/ML INJ: 10.0000 mg | INTRAVENOUS | Status: DC | PRN

## 2024-02-20 MED ORDER — LACTATED RINGERS IV SOLN: 500.0000 mL | INTRAVENOUS | Status: DC | PRN

## 2024-02-20 MED ORDER — LACTATED RINGERS IV SOLN: 500.0000 mL | Freq: Once | INTRAVENOUS | Status: DC

## 2024-02-20 MED ORDER — SOD CITRATE-CITRIC ACID 500-334 MG/5ML PO SOLN
30.0000 mL | ORAL | Status: DC | PRN
  Administered 2024-02-20: 30 mL via ORAL
  Filled 2024-02-20: qty 30

## 2024-02-20 MED ORDER — ONDANSETRON HCL 4 MG/2ML IJ SOLN: 4.0000 mg | Freq: Four times a day (QID) | INTRAMUSCULAR | Status: DC | PRN

## 2024-02-20 MED ORDER — FENTANYL CITRATE (PF) 100 MCG/2ML IJ SOLN
50.0000 ug | INTRAMUSCULAR | Status: DC | PRN
  Administered 2024-02-20 (×2): 100 ug via INTRAVENOUS
  Filled 2024-02-20 (×2): qty 2

## 2024-02-20 MED ORDER — LIDOCAINE HCL (PF) 1 % IJ SOLN: 30.0000 mL | INTRAMUSCULAR | Status: DC | PRN

## 2024-02-20 MED ORDER — SENNOSIDES-DOCUSATE SODIUM 8.6-50 MG PO TABS
2.0000 | ORAL_TABLET | ORAL | Status: DC
  Administered 2024-02-21 – 2024-02-23 (×3): 2 via ORAL
  Filled 2024-02-20 (×3): qty 2

## 2024-02-20 MED ORDER — SODIUM CHLORIDE 0.9 % IV SOLN: 5.0000 10*6.[IU] | Freq: Once | INTRAVENOUS | Status: DC

## 2024-02-20 MED ORDER — WITCH HAZEL-GLYCERIN EX PADS: 1.0000 | MEDICATED_PAD | CUTANEOUS | Status: DC | PRN

## 2024-02-20 MED ORDER — LABETALOL HCL 5 MG/ML IV SOLN: 40.0000 mg | INTRAVENOUS | Status: DC | PRN

## 2024-02-20 MED ORDER — MAGNESIUM SULFATE 40 GM/1000ML IV SOLN
2.0000 g/h | INTRAVENOUS | Status: AC
  Administered 2024-02-21: 2 g/h via INTRAVENOUS
  Filled 2024-02-20: qty 1000

## 2024-02-20 MED ORDER — MAGNESIUM SULFATE 40 GM/1000ML IV SOLN
2.0000 g/h | INTRAVENOUS | Status: DC
  Administered 2024-02-20: 2 g/h via INTRAVENOUS
  Filled 2024-02-20: qty 1000

## 2024-02-20 MED ORDER — MAGNESIUM SULFATE BOLUS VIA INFUSION
4.0000 g | Freq: Once | INTRAVENOUS | Status: AC
  Administered 2024-02-20: 4 g via INTRAVENOUS
  Filled 2024-02-20: qty 1000

## 2024-02-20 MED ORDER — OXYTOCIN BOLUS FROM INFUSION
333.0000 mL | Freq: Once | INTRAVENOUS | Status: AC
  Administered 2024-02-20: 333 mL via INTRAVENOUS

## 2024-02-20 MED ORDER — LACTATED RINGERS IV SOLN: INTRAVENOUS | Status: DC

## 2024-02-20 MED ORDER — PRENATAL MULTIVITAMIN CH
1.0000 | ORAL_TABLET | Freq: Every day | ORAL | Status: DC
  Administered 2024-02-21 – 2024-02-23 (×3): 1 via ORAL
  Filled 2024-02-20 (×3): qty 1

## 2024-02-20 MED ORDER — IBUPROFEN 600 MG PO TABS
600.0000 mg | ORAL_TABLET | Freq: Four times a day (QID) | ORAL | Status: DC
  Administered 2024-02-20 – 2024-02-23 (×11): 600 mg via ORAL
  Filled 2024-02-20 (×11): qty 1

## 2024-02-20 MED ORDER — FLEET ENEMA RE ENEM: 1.0000 | ENEMA | RECTAL | Status: DC | PRN

## 2024-02-20 MED ORDER — PHENYLEPHRINE 80 MCG/ML (10ML) SYRINGE FOR IV PUSH (FOR BLOOD PRESSURE SUPPORT): 80.0000 ug | PREFILLED_SYRINGE | INTRAVENOUS | Status: DC | PRN

## 2024-02-20 MED ORDER — SIMETHICONE 80 MG PO CHEW: 80.0000 mg | CHEWABLE_TABLET | ORAL | Status: DC | PRN

## 2024-02-20 MED ORDER — DIPHENHYDRAMINE HCL 25 MG PO CAPS: 25.0000 mg | ORAL_CAPSULE | Freq: Four times a day (QID) | ORAL | Status: DC | PRN

## 2024-02-20 MED ORDER — FENTANYL-BUPIVACAINE-NACL 0.5-0.125-0.9 MG/250ML-% EP SOLN: 12.0000 mL/h | EPIDURAL | Status: DC | PRN

## 2024-02-20 MED ORDER — TETANUS-DIPHTH-ACELL PERTUSSIS 5-2-15.5 LF-MCG/0.5 IM SUSP: 0.5000 mL | Freq: Once | INTRAMUSCULAR | Status: DC

## 2024-02-20 MED ORDER — DIBUCAINE (PERIANAL) 1 % EX OINT: 1.0000 | TOPICAL_OINTMENT | CUTANEOUS | Status: DC | PRN

## 2024-02-20 MED ORDER — OXYCODONE HCL 5 MG PO TABS
5.0000 mg | ORAL_TABLET | ORAL | Status: DC | PRN
  Administered 2024-02-21 – 2024-02-23 (×2): 5 mg via ORAL
  Filled 2024-02-20 (×3): qty 1

## 2024-02-20 MED ORDER — ACETAMINOPHEN 500 MG PO TABS
1000.0000 mg | ORAL_TABLET | Freq: Four times a day (QID) | ORAL | Status: DC
  Administered 2024-02-20 – 2024-02-23 (×11): 1000 mg via ORAL
  Filled 2024-02-20 (×11): qty 2

## 2024-02-20 MED ORDER — DIPHENHYDRAMINE HCL 50 MG/ML IJ SOLN: 12.5000 mg | INTRAMUSCULAR | Status: DC | PRN

## 2024-02-20 MED ORDER — LABETALOL HCL 5 MG/ML IV SOLN: 80.0000 mg | INTRAVENOUS | Status: DC | PRN

## 2024-02-20 MED ORDER — ONDANSETRON HCL 4 MG PO TABS: 4.0000 mg | ORAL_TABLET | ORAL | Status: DC | PRN

## 2024-02-20 MED ORDER — ONDANSETRON HCL 4 MG/2ML IJ SOLN
4.0000 mg | INTRAMUSCULAR | Status: DC | PRN
  Filled 2024-02-20: qty 2
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
## 2024-02-20 NOTE — MAU Note (Signed)
 Sue Young is a 34 y.o. at Unknown here in MAU reporting: ctx started at 64 and are every 2-3 mins apart. Denies any LOF. Reports blood mucus. Reports +FM   Onset of complaint: 0745 Pain score: 6- no epidural Vitals:   02/20/24 0958  BP: (!) 159/93  Pulse: 80  Temp: (!) 97.4 F (36.3 C)     FHT:140 Lab orders placed from triage:  labor eval

## 2024-02-20 NOTE — MAU Provider Note (Signed)
 S [name] is a 34 y.o. G82P2002 female who presents to MAU today with complaint of labor/contractions.   ROS: ctx every 3-4 minute, +bloody show O BP (!) 159/93 (BP Location: Right Arm)   Pulse 80   Temp (!) 97.4 F (36.3 C) (Oral)  Physical Exam Vitals and nursing note reviewed.  Constitutional:      General: She is not in acute distress.    Appearance: She is well-developed.     Comments: Pregnant female  HENT:     Head: Normocephalic and atraumatic.  Eyes:     General: No scleral icterus.    Conjunctiva/sclera: Conjunctivae normal.  Cardiovascular:     Rate and Rhythm: Normal rate.  Pulmonary:     Effort: Pulmonary effort is normal.  Chest:     Chest wall: No tenderness.  Abdominal:     Palpations: Abdomen is soft.     Tenderness: There is no abdominal tenderness. There is no guarding or rebound.     Comments: Gravid  Genitourinary:    Vagina: Normal.  Musculoskeletal:        General: Normal range of motion.     Cervical back: Normal range of motion and neck supple.  Skin:    General: Skin is warm and dry.     Findings: No rash.  Neurological:     Mental Status: She is alert and oriented to person, place, and time.    Dilation: 4 Effacement (%): 80 Cervical Position: Anterior Station: -3 Presentation: Vertex Exam by:: Suzen Masters MD    MDM: low  A Labor  P Admit to LD Spoke with CNM Mercer who will admit Reviewed elevated blood pressures and CNM will order PEC labs. Patient denies HA, blurry vision, RUQ pain or swelling. No elevated BPs in office  [name], MD 02/20/2024 10:10 AM

## 2024-02-20 NOTE — Progress Notes (Signed)
 CBC    Component Value Date/Time   WBC 11.7 (H) 02/20/2024 1033   RBC 4.77 02/20/2024 1033   HGB 13.4 02/20/2024 1033   HGB 11.0 (L) 05/23/2014 1141   HCT 39.0 02/20/2024 1033   HCT 34.0 (L) 05/23/2014 1141   PLT 84 (L) 02/20/2024 1033   PLT 81 (L) 05/23/2014 1141   MCV 81.8 02/20/2024 1033   MCV 81.3 05/23/2014 1141   MCH 28.1 02/20/2024 1033   MCHC 34.4 02/20/2024 1033   RDW 14.5 02/20/2024 1033   RDW 14.2 05/23/2014 1141   LYMPHSABS 2.4 12/07/2022 1451   LYMPHSABS 1.7 05/23/2014 1141   MONOABS 0.6 12/07/2022 1451   MONOABS 0.7 05/23/2014 1141   EOSABS 0.7 (H) 12/07/2022 1451   EOSABS 0.4 05/23/2014 1141   BASOSABS 0.1 12/07/2022 1451   BASOSABS 0.1 05/23/2014 1141   CMP     Component Value Date/Time   NA 136 02/20/2024 1033   NA 138 04/10/2014 1106   K 4.3 02/20/2024 1033   K 3.6 04/10/2014 1106   CL 101 02/20/2024 1033   CO2 21 (L) 02/20/2024 1033   CO2 22 04/10/2014 1106   GLUCOSE 76 02/20/2024 1033   GLUCOSE 101 04/10/2014 1106   BUN 7 02/20/2024 1033   BUN 5.5 (L) 04/10/2014 1106   CREATININE 0.74 02/20/2024 1033   CREATININE 0.7 04/10/2014 1106   CALCIUM 9.4 02/20/2024 1033   CALCIUM 8.8 04/10/2014 1106   PROT 7.5 02/20/2024 1033   PROT 7.2 04/10/2014 1106   ALBUMIN 3.9 02/20/2024 1033   ALBUMIN 3.0 (L) 04/10/2014 1106   AST 27 02/20/2024 1033   AST 15 04/10/2014 1106   ALT 19 02/20/2024 1033   ALT 14 04/10/2014 1106   ALKPHOS 136 (H) 02/20/2024 1033   ALKPHOS 100 04/10/2014 1106   BILITOT 0.3 02/20/2024 1033   BILITOT 0.47 04/10/2014 1106   EGFR >90 04/10/2014 1106   GFRNONAA >60 02/20/2024 1033   Protein creatinine ratio 0.4  A/P: Pre-eclampsia w/ severe features    -hypertensive protocol    -start magnesium 

## 2024-02-20 NOTE — Progress Notes (Signed)
 IV 20 G LAC placed by EMS. Patient requesting to have IV removed due to pain at insertion site. Patient aware and ok with starting a new IV site if needed or before admission.

## 2024-02-21 ENCOUNTER — Encounter (HOSPITAL_COMMUNITY): Payer: Self-pay | Admitting: Obstetrics and Gynecology

## 2024-02-21 LAB — COMPREHENSIVE METABOLIC PANEL WITH GFR
ALT: 19 U/L (ref 0–44)
AST: 29 U/L (ref 15–41)
Albumin: 3 g/dL — ABNORMAL LOW (ref 3.5–5.0)
Alkaline Phosphatase: 99 U/L (ref 38–126)
Anion gap: 9 (ref 5–15)
BUN: 6 mg/dL (ref 6–20)
CO2: 23 mmol/L (ref 22–32)
Calcium: 7.4 mg/dL — ABNORMAL LOW (ref 8.9–10.3)
Chloride: 102 mmol/L (ref 98–111)
Creatinine, Ser: 0.77 mg/dL (ref 0.44–1.00)
GFR, Estimated: >60 mL/min
Glucose, Bld: 98 mg/dL (ref 70–99)
Potassium: 3.9 mmol/L (ref 3.5–5.1)
Sodium: 134 mmol/L — ABNORMAL LOW (ref 135–145)
Total Bilirubin: <0.2 mg/dL (ref 0.0–1.2)
Total Protein: 5.6 g/dL — ABNORMAL LOW (ref 6.5–8.1)

## 2024-02-21 LAB — CBC
HCT: 31.3 % — ABNORMAL LOW (ref 36.0–46.0)
Hemoglobin: 10.6 g/dL — ABNORMAL LOW (ref 12.0–15.0)
MCH: 27.8 pg (ref 26.0–34.0)
MCHC: 33.9 g/dL (ref 30.0–36.0)
MCV: 82.2 fL (ref 80.0–100.0)
Platelets: 63 10*3/uL — ABNORMAL LOW (ref 150–400)
RBC: 3.81 MIL/uL — ABNORMAL LOW (ref 3.87–5.11)
RDW: 14.4 % (ref 11.5–15.5)
WBC: 13.9 10*3/uL — ABNORMAL HIGH (ref 4.0–10.5)
nRBC: 0 % (ref 0.0–0.2)

## 2024-02-21 LAB — MAGNESIUM: Magnesium: 5.4 mg/dL — ABNORMAL HIGH (ref 1.7–2.4)

## 2024-02-21 MED ORDER — LACTATED RINGERS IV SOLN: INTRAVENOUS | Status: AC

## 2024-02-21 NOTE — Lactation Note (Signed)
 This note was copied from a baby's chart. Lactation Consultation Note  Patient Name: Sue Young Unijb'd Date: 02/21/2024 Age:32 hours Reason for consult: Initial assessment;Term  P3. Mom stated BF going very well. Baby is cluster feeding now. Mom denies difficulty latching or painful latches. Mom has no questions or concerns at BF. Praised mom for doing so good. Mom was giving some DBM because she was on Mag. And so out of it per mom. Now baby is cluster feeding. Mom has no concerns or needs of assistance. Maternal Data Does the patient have breastfeeding experience prior to this delivery?: Yes How long did the patient breastfeed?: 1st child 10 months, 2nd child 1 yr  Feeding Mother's Current Feeding Choice: Breast Milk and Donor Milk Nipple Type: Dr. Jonna Fling Preemie  LATCH Score                    Lactation Tools Discussed/Used    Interventions    Discharge Pump: DEBP;Hands Free (getting Medela from insurance that has been ordered. Has Mom Cozy and Lansinoh)  Consult Status Consult Status: Complete    Gonzalo Waymire G 02/21/2024, 10:30 PM

## 2024-02-21 NOTE — Progress Notes (Signed)
 PPD# 1 SVD w/ 1st degree laceration hemostatic Information for the patient's newborn:  Aristea, Posada [[id number]]  female  Request circumcision before discharge: Yes   S:   Reports feeling very good Tolerating PO fluid and solids No nausea or vomiting Bleeding is light Pain controlled with acetaminophen  and ibuprofen  (OTC) Up ad lib / ambulatory / voiding w/o difficulty Feeding: Breast    O:   VS: BP (!) 151/77 (BP Location: Right Arm)   Pulse 82   Temp 98.3 F (36.8 C) (Oral)   Resp 15   SpO2 98%   LABS:  Recent Labs    02/20/24 1033 02/21/24 0422  WBC 11.7* 13.9*  HGB 13.4 10.6*  PLT 84* 63*   Blood type: --/--/B POS (01/26 1028) Rubella: Immune (06/18 0000)                      I&O: Intake/Output      01/26 0701 01/27 0700   P.O. 480   I.V. 1980.4   Total Intake 2460.4   Urine 2300   Blood 539   Total Output 2839   Net -378.6         Physical Exam: Alert and oriented X3 Lungs: Clear and unlabored Heart: regular rate and rhythm / no mumurs Abdomen: soft, non-tender, non-distended  Fundus: firm, non-tender, U-2 Perineum: intact Lochia: appropriate Extremities: no edema, negative for calf pain, tenderness, or cords    A:  PPD # 1  Normal exam Pre-eclampsia w/ SF    -persistent moderate range BP    -plan oral antihypertensives after mag is dc'd Thrombocytopenia      -platelets decreased from 84 on admission to 63 this AM   P:  Routine postpartum orders DC magnesium  24 hrs after delivery, order placed Contraception: natural family planning Anticipate D/C on PP day 2 Plan reviewed w/ Dr. Henry Mercer KATHEE Arnell, DNP, CNM 02/21/2024, 5:41 AM

## 2024-02-22 ENCOUNTER — Inpatient Hospital Stay (HOSPITAL_COMMUNITY): Admit: 2024-02-22

## 2024-02-22 MED ORDER — NIFEDIPINE ER OSMOTIC RELEASE 30 MG PO TB24
30.0000 mg | ORAL_TABLET | Freq: Once | ORAL | Status: AC
  Administered 2024-02-22: 30 mg via ORAL
  Filled 2024-02-22: qty 1

## 2024-02-22 MED ORDER — NIFEDIPINE ER OSMOTIC RELEASE 60 MG PO TB24: 60.0000 mg | ORAL_TABLET | Freq: Every day | ORAL | Status: DC

## 2024-02-22 MED ORDER — NIFEDIPINE ER OSMOTIC RELEASE 30 MG PO TB24
30.0000 mg | ORAL_TABLET | Freq: Every day | ORAL | Status: DC
  Administered 2024-02-22: 30 mg via ORAL
  Filled 2024-02-22 (×2): qty 1

## 2024-02-22 NOTE — Progress Notes (Signed)
 Post Partum Day3 Subjective: Pt has an occasional headache, the headache goes away  with tylenol   , voiding and tolerating PO and breast feeding  Objective: Afebrile VSS  Physical Exam:  General: alert Lochia: appropriate Uterine Fundus: firm CV RRR Lungs CTA B   DVT Evaluation: No evidence of DVT seen on physical exam.  Recent Labs    02/20/24 1033 02/21/24 0422  HGB 13.4 10.6*  HCT 39.0 31.3*   CBC    Component Value Date/Time   WBC 13.9 (H) 02/21/2024 0422   RBC 3.81 (L) 02/21/2024 0422   HGB 10.6 (L) 02/21/2024 0422   HGB 11.0 (L) 05/23/2014 1141   HCT 31.3 (L) 02/21/2024 0422   HCT 34.0 (L) 05/23/2014 1141   PLT 63 (L) 02/21/2024 0422   PLT 81 (L) 05/23/2014 1141   MCV 82.2 02/21/2024 0422   MCV 81.3 05/23/2014 1141   MCH 27.8 02/21/2024 0422   MCHC 33.9 02/21/2024 0422   RDW 14.4 02/21/2024 0422   RDW 14.2 05/23/2014 1141   LYMPHSABS 2.4 12/07/2022 1451   LYMPHSABS 1.7 05/23/2014 1141   MONOABS 0.6 12/07/2022 1451   MONOABS 0.7 05/23/2014 1141   EOSABS 0.7 (H) 12/07/2022 1451   EOSABS 0.4 05/23/2014 1141   BASOSABS 0.1 12/07/2022 1451   BASOSABS 0.1 05/23/2014 1141   CMP     Component Value Date/Time   NA 134 (L) 02/21/2024 0422   NA 138 04/10/2014 1106   K 3.9 02/21/2024 0422   K 3.6 04/10/2014 1106   CL 102 02/21/2024 0422   CO2 23 02/21/2024 0422   CO2 22 04/10/2014 1106   GLUCOSE 98 02/21/2024 0422   GLUCOSE 101 04/10/2014 1106   BUN 6 02/21/2024 0422   BUN 5.5 (L) 04/10/2014 1106   CREATININE 0.77 02/21/2024 0422   CREATININE 0.7 04/10/2014 1106   CALCIUM 7.4 (L) 02/21/2024 0422   CALCIUM 8.8 04/10/2014 1106   PROT 5.6 (L) 02/21/2024 0422   PROT 7.2 04/10/2014 1106   ALBUMIN 3.0 (L) 02/21/2024 0422   ALBUMIN 3.0 (L) 04/10/2014 1106   AST 29 02/21/2024 0422   AST 15 04/10/2014 1106   ALT 19 02/21/2024 0422   ALT 14 04/10/2014 1106   ALKPHOS 99 02/21/2024 0422   ALKPHOS 100 04/10/2014 1106   BILITOT <0.2 02/21/2024 0422   BILITOT  0.47 04/10/2014 1106   EGFR >90 04/10/2014 1106   GFRNONAA >60 02/21/2024 0422    Assessment/Plan: PPD 3.  Preeclampsia with severe features Blood pressures are elevated.   Will start procardia  Pt breast  feeding Routine care Plan for DC tomorrow   LOS: 2 days   Meshilem Machuca A Yocelin Vanlue 02/22/2024, 3:03 PM

## 2024-02-22 NOTE — Plan of Care (Signed)
  Problem: Education: Goal: Knowledge of General Education information will improve Description: Including pain rating scale, medication(s)/side effects and non-pharmacologic comfort measures Outcome: Progressing   Problem: Health Behavior/Discharge Planning: Goal: Ability to manage health-related needs will improve Outcome: Progressing   Problem: Clinical Measurements: Goal: Ability to maintain clinical measurements within normal limits will improve Outcome: Progressing Goal: Will remain free from infection Outcome: Progressing Goal: Diagnostic test results will improve Outcome: Progressing Goal: Respiratory complications will improve Outcome: Progressing Goal: Cardiovascular complication will be avoided Outcome: Progressing   Problem: Activity: Goal: Risk for activity intolerance will decrease Outcome: Progressing   Problem: Nutrition: Goal: Adequate nutrition will be maintained Outcome: Progressing   Problem: Coping: Goal: Level of anxiety will decrease Outcome: Progressing   Problem: Elimination: Goal: Will not experience complications related to bowel motility Outcome: Progressing Goal: Will not experience complications related to urinary retention Outcome: Progressing   Problem: Pain Managment: Goal: General experience of comfort will improve and/or be controlled Outcome: Progressing   Problem: Safety: Goal: Ability to remain free from injury will improve Outcome: Progressing   Problem: Skin Integrity: Goal: Risk for impaired skin integrity will decrease Outcome: Progressing   Problem: Education: Goal: Knowledge of disease or condition will improve Outcome: Progressing Goal: Knowledge of the prescribed therapeutic regimen will improve Outcome: Progressing   Problem: Fluid Volume: Goal: Peripheral tissue perfusion will improve Outcome: Progressing   Problem: Clinical Measurements: Goal: Complications related to disease process, condition or treatment  will be avoided or minimized Outcome: Progressing   Problem: Education: Goal: Knowledge of condition will improve Outcome: Progressing Goal: Individualized Educational Video(s) Outcome: Progressing Goal: Individualized Newborn Educational Video(s) Outcome: Progressing   Problem: Activity: Goal: Will verbalize the importance of balancing activity with adequate rest periods Outcome: Progressing Goal: Ability to tolerate increased activity will improve Outcome: Progressing   Problem: Coping: Goal: Ability to identify and utilize available resources and services will improve Outcome: Progressing   Problem: Life Cycle: Goal: Chance of risk for complications during the postpartum period will decrease Outcome: Progressing   Problem: Role Relationship: Goal: Ability to demonstrate positive interaction with newborn will improve Outcome: Progressing   Problem: Skin Integrity: Goal: Demonstration of wound healing without infection will improve Outcome: Progressing
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
## 2024-02-22 NOTE — Social Work (Signed)
 CSW acknowledged consult and completed a clinical assessment.  There are no barriers to d/c.  Clinical assessment notes will be entered at a later time.  Nat Quiet, MSW, LCSW Clinical Social Worker  [phone] 2024-02-11  11:44 AM

## 2024-02-23 MED ORDER — NIFEDIPINE ER OSMOTIC RELEASE 90 MG PO TB24: 90.0000 mg | ORAL_TABLET | Freq: Every day | ORAL | 1 refills | Status: AC

## 2024-02-23 MED ORDER — NIFEDIPINE ER OSMOTIC RELEASE 60 MG PO TB24
90.0000 mg | ORAL_TABLET | Freq: Every day | ORAL | Status: DC
  Administered 2024-02-23: 90 mg via ORAL
  Filled 2024-02-23: qty 1
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
## 2024-02-23 NOTE — Progress Notes (Signed)
 Discharge instructions and prescriptions given to pt. Discussed post vaginal delivery care, signs and symptoms to report to the OB, upcoming appointments, and meds. Pt verbalizes understanding and has no questions or concerns at this time. Pt discharged home with baby in stable condition.

## 2024-02-28 ENCOUNTER — Telehealth (HOSPITAL_COMMUNITY): Payer: Self-pay | Admitting: *Deleted

## 2024-02-28 NOTE — Telephone Encounter (Signed)
 Attempted hospital discharge follow-up call. Left message for patient to return RN call with any questions or concerns. Allean IVAR Carton, RN, 03/02/24, [phone]
